# Patient Record
Sex: Male | Born: 1960 | Race: Black or African American | Hispanic: No | Marital: Single | Smoking: Former smoker
Health system: Southern US, Community
[De-identification: ages and names within clinical notes are randomized; demographics above are authoritative.]

## PROBLEM LIST (undated history)

## (undated) DIAGNOSIS — K219 Gastro-esophageal reflux disease without esophagitis: Secondary | ICD-10-CM

## (undated) DIAGNOSIS — G4730 Sleep apnea, unspecified: Secondary | ICD-10-CM

## (undated) DIAGNOSIS — I10 Essential (primary) hypertension: Secondary | ICD-10-CM

## (undated) DIAGNOSIS — D649 Anemia, unspecified: Secondary | ICD-10-CM

## (undated) DIAGNOSIS — D573 Sickle-cell trait: Secondary | ICD-10-CM

## (undated) DIAGNOSIS — K759 Inflammatory liver disease, unspecified: Secondary | ICD-10-CM

## (undated) DIAGNOSIS — M1990 Unspecified osteoarthritis, unspecified site: Secondary | ICD-10-CM

## (undated) HISTORY — PX: KNEE SURGERY: SHX244

---

## 1999-12-14 ENCOUNTER — Encounter: Payer: Self-pay | Admitting: Oncology

## 1999-12-14 ENCOUNTER — Encounter: Admission: RE | Admit: 1999-12-14 | Discharge: 1999-12-14 | Payer: Self-pay | Admitting: Oncology

## 2001-01-02 ENCOUNTER — Emergency Department (HOSPITAL_COMMUNITY): Admission: EM | Admit: 2001-01-02 | Discharge: 2001-01-02 | Payer: Self-pay | Admitting: Emergency Medicine

## 2001-01-02 ENCOUNTER — Encounter: Payer: Self-pay | Admitting: Emergency Medicine

## 2001-09-25 ENCOUNTER — Emergency Department (HOSPITAL_COMMUNITY): Admission: EM | Admit: 2001-09-25 | Discharge: 2001-09-25 | Payer: Self-pay | Admitting: Emergency Medicine

## 2002-09-05 ENCOUNTER — Encounter: Payer: Self-pay | Admitting: Internal Medicine

## 2002-09-05 ENCOUNTER — Encounter: Admission: RE | Admit: 2002-09-05 | Discharge: 2002-09-05 | Payer: Self-pay | Admitting: Internal Medicine

## 2004-11-08 ENCOUNTER — Ambulatory Visit: Payer: Self-pay | Admitting: Oncology

## 2005-05-03 ENCOUNTER — Ambulatory Visit: Payer: Self-pay | Admitting: Oncology

## 2006-04-29 ENCOUNTER — Ambulatory Visit: Payer: Self-pay | Admitting: Oncology

## 2006-05-04 LAB — CBC WITH DIFFERENTIAL/PLATELET
BASO%: 0.7 % (ref 0.0–2.0)
Basophils Absolute: 0.1 10*3/uL (ref 0.0–0.1)
EOS%: 5.4 % (ref 0.0–7.0)
Eosinophils Absolute: 0.5 10*3/uL (ref 0.0–0.5)
HCT: 40.7 % (ref 38.7–49.9)
HGB: 13.5 g/dL (ref 13.0–17.1)
LYMPH%: 30.4 % (ref 14.0–48.0)
MCH: 28.4 pg (ref 28.0–33.4)
MCHC: 33.2 g/dL (ref 32.0–35.9)
MCV: 85.6 fL (ref 81.6–98.0)
MONO#: 0.4 10*3/uL (ref 0.1–0.9)
MONO%: 4.6 % (ref 0.0–13.0)
NEUT#: 5.3 10*3/uL (ref 1.5–6.5)
NEUT%: 58.9 % (ref 40.0–75.0)
Platelets: 145 10*3/uL (ref 145–400)
RBC: 4.75 10*6/uL (ref 4.20–5.71)
RDW: 17.7 % — ABNORMAL HIGH (ref 11.2–14.6)
WBC: 9 10*3/uL (ref 4.0–10.0)
lymph#: 2.7 10*3/uL (ref 0.9–3.3)

## 2007-05-01 ENCOUNTER — Ambulatory Visit: Payer: Self-pay | Admitting: Oncology

## 2007-05-31 LAB — CBC WITH DIFFERENTIAL/PLATELET
Basophils Absolute: 0 10*3/uL (ref 0.0–0.1)
Eosinophils Absolute: 0.7 10*3/uL — ABNORMAL HIGH (ref 0.0–0.5)
HGB: 13.9 g/dL (ref 13.0–17.1)
MCV: 85.1 fL (ref 81.6–98.0)
MONO#: 0.4 10*3/uL (ref 0.1–0.9)
MONO%: 4.1 % (ref 0.0–13.0)
NEUT#: 6 10*3/uL (ref 1.5–6.5)
RBC: 4.93 10*6/uL (ref 4.20–5.71)
RDW: 18 % — ABNORMAL HIGH (ref 11.2–14.6)
WBC: 10.5 10*3/uL — ABNORMAL HIGH (ref 4.0–10.0)
lymph#: 3.3 10*3/uL (ref 0.9–3.3)

## 2008-05-28 ENCOUNTER — Ambulatory Visit: Payer: Self-pay | Admitting: Oncology

## 2008-05-30 LAB — CBC WITH DIFFERENTIAL/PLATELET
BASO%: 0.6 % (ref 0.0–2.0)
Basophils Absolute: 0.1 10*3/uL (ref 0.0–0.1)
EOS%: 7.2 % — ABNORMAL HIGH (ref 0.0–7.0)
Eosinophils Absolute: 0.7 10*3/uL — ABNORMAL HIGH (ref 0.0–0.5)
HCT: 37.4 % — ABNORMAL LOW (ref 38.7–49.9)
HGB: 12.7 g/dL — ABNORMAL LOW (ref 13.0–17.1)
LYMPH%: 27.4 % (ref 14.0–48.0)
MCH: 29 pg (ref 28.0–33.4)
MCHC: 34 g/dL (ref 32.0–35.9)
MCV: 85.4 fL (ref 81.6–98.0)
MONO#: 0.3 10*3/uL (ref 0.1–0.9)
MONO%: 3.6 % (ref 0.0–13.0)
NEUT#: 5.8 10*3/uL (ref 1.5–6.5)
NEUT%: 61.2 % (ref 40.0–75.0)
Platelets: 132 10*3/uL — ABNORMAL LOW (ref 145–400)
RBC: 4.38 10*6/uL (ref 4.20–5.71)
RDW: 17.8 % — ABNORMAL HIGH (ref 11.2–14.6)
WBC: 9.4 10*3/uL (ref 4.0–10.0)
lymph#: 2.6 10*3/uL (ref 0.9–3.3)

## 2009-05-26 ENCOUNTER — Ambulatory Visit: Payer: Self-pay | Admitting: Oncology

## 2009-05-28 LAB — CBC WITH DIFFERENTIAL/PLATELET
BASO%: 0.6 % (ref 0.0–2.0)
Basophils Absolute: 0.1 10*3/uL (ref 0.0–0.1)
EOS%: 6.9 % (ref 0.0–7.0)
Eosinophils Absolute: 0.6 10*3/uL — ABNORMAL HIGH (ref 0.0–0.5)
HCT: 35.6 % — ABNORMAL LOW (ref 38.4–49.9)
HGB: 12.3 g/dL — ABNORMAL LOW (ref 13.0–17.1)
LYMPH%: 30.1 % (ref 14.0–49.0)
MCH: 27.5 pg (ref 27.2–33.4)
MCHC: 34.6 g/dL (ref 32.0–36.0)
MCV: 79.6 fL (ref 79.3–98.0)
MONO#: 0.4 10*3/uL (ref 0.1–0.9)
MONO%: 4.2 % (ref 0.0–14.0)
NEUT#: 5.4 10*3/uL (ref 1.5–6.5)
NEUT%: 58.2 % (ref 39.0–75.0)
Platelets: 102 10*3/uL — ABNORMAL LOW (ref 140–400)
RBC: 4.47 10*6/uL (ref 4.20–5.82)
RDW: 16.9 % — ABNORMAL HIGH (ref 11.0–14.6)
WBC: 9.3 10*3/uL (ref 4.0–10.3)
lymph#: 2.8 10*3/uL (ref 0.9–3.3)
nRBC: 1 % — ABNORMAL HIGH (ref 0–0)

## 2009-11-23 ENCOUNTER — Ambulatory Visit: Payer: Self-pay | Admitting: Oncology

## 2009-11-25 LAB — CBC WITH DIFFERENTIAL/PLATELET
BASO%: 1.5 % (ref 0.0–2.0)
Basophils Absolute: 0.2 10*3/uL — ABNORMAL HIGH (ref 0.0–0.1)
EOS%: 5.3 % (ref 0.0–7.0)
Eosinophils Absolute: 0.6 10*3/uL — ABNORMAL HIGH (ref 0.0–0.5)
HCT: 37 % — ABNORMAL LOW (ref 38.4–49.9)
HGB: 12.4 g/dL — ABNORMAL LOW (ref 13.0–17.1)
LYMPH%: 31.8 % (ref 14.0–49.0)
MCH: 28.7 pg (ref 27.2–33.4)
MCHC: 33.6 g/dL (ref 32.0–36.0)
MCV: 85.2 fL (ref 79.3–98.0)
MONO#: 0.6 10*3/uL (ref 0.1–0.9)
MONO%: 5.3 % (ref 0.0–14.0)
NEUT#: 5.9 10*3/uL (ref 1.5–6.5)
NEUT%: 56.1 % (ref 39.0–75.0)
Platelets: 132 10*3/uL — ABNORMAL LOW (ref 140–400)
RBC: 4.34 10*6/uL (ref 4.20–5.82)
RDW: 17.9 % — ABNORMAL HIGH (ref 11.0–14.6)
WBC: 10.6 10*3/uL — ABNORMAL HIGH (ref 4.0–10.3)
lymph#: 3.4 10*3/uL — ABNORMAL HIGH (ref 0.9–3.3)

## 2010-05-25 ENCOUNTER — Ambulatory Visit: Payer: Self-pay | Admitting: Oncology

## 2010-05-27 LAB — CBC WITH DIFFERENTIAL/PLATELET
BASO%: 0.3 % (ref 0.0–2.0)
Basophils Absolute: 0 10*3/uL (ref 0.0–0.1)
EOS%: 5.4 % (ref 0.0–7.0)
Eosinophils Absolute: 0.6 10*3/uL — ABNORMAL HIGH (ref 0.0–0.5)
HCT: 41.4 % (ref 38.4–49.9)
HGB: 13.9 g/dL (ref 13.0–17.1)
LYMPH%: 28.6 % (ref 14.0–49.0)
MCH: 28.6 pg (ref 27.2–33.4)
MCHC: 33.6 g/dL (ref 32.0–36.0)
MCV: 85.3 fL (ref 79.3–98.0)
MONO#: 0.5 10*3/uL (ref 0.1–0.9)
MONO%: 4.6 % (ref 0.0–14.0)
NEUT#: 7 10*3/uL — ABNORMAL HIGH (ref 1.5–6.5)
NEUT%: 61.1 % (ref 39.0–75.0)
Platelets: 137 10*3/uL — ABNORMAL LOW (ref 140–400)
RBC: 4.86 10*6/uL (ref 4.20–5.82)
RDW: 17.7 % — ABNORMAL HIGH (ref 11.0–14.6)
WBC: 11.4 10*3/uL — ABNORMAL HIGH (ref 4.0–10.3)
lymph#: 3.3 10*3/uL (ref 0.9–3.3)

## 2011-05-16 ENCOUNTER — Telehealth: Payer: Self-pay | Admitting: Oncology

## 2011-05-16 NOTE — Telephone Encounter (Signed)
per Dr. Truett Perna moved pt off his schedule to [facility] for 01-24

## 2011-05-26 ENCOUNTER — Other Ambulatory Visit: Payer: Self-pay | Admitting: Lab

## 2011-05-26 ENCOUNTER — Other Ambulatory Visit: Payer: Self-pay | Admitting: *Deleted

## 2011-05-26 ENCOUNTER — Ambulatory Visit: Payer: Self-pay | Admitting: Nurse Practitioner

## 2011-05-26 ENCOUNTER — Ambulatory Visit: Payer: Self-pay | Admitting: Oncology

## 2011-05-26 ENCOUNTER — Telehealth: Payer: Self-pay | Admitting: *Deleted

## 2011-05-26 ENCOUNTER — Telehealth: Payer: Self-pay | Admitting: Oncology

## 2011-05-26 NOTE — Telephone Encounter (Signed)
Called pt to reschedule office visit due to unforeseen schedule changes. Pt agrees to move appt one month. He denies any new issues/ changes in clinical status.

## 2011-05-26 NOTE — Telephone Encounter (Signed)
Due to LT out 1/24 appt moved to 2/21 per 1/24 pof. Pt has been contacted by nurse and informed that appt would be r/s'd. Feb schedule mailed.

## 2011-06-23 ENCOUNTER — Ambulatory Visit (HOSPITAL_BASED_OUTPATIENT_CLINIC_OR_DEPARTMENT_OTHER): Payer: BC Managed Care – PPO | Admitting: Nurse Practitioner

## 2011-06-23 ENCOUNTER — Other Ambulatory Visit (HOSPITAL_BASED_OUTPATIENT_CLINIC_OR_DEPARTMENT_OTHER): Payer: BC Managed Care – PPO | Admitting: Lab

## 2011-06-23 DIAGNOSIS — D696 Thrombocytopenia, unspecified: Secondary | ICD-10-CM

## 2011-06-23 DIAGNOSIS — D582 Other hemoglobinopathies: Secondary | ICD-10-CM

## 2011-06-23 DIAGNOSIS — D72829 Elevated white blood cell count, unspecified: Secondary | ICD-10-CM

## 2011-06-23 DIAGNOSIS — D731 Hypersplenism: Secondary | ICD-10-CM

## 2011-06-23 LAB — CBC WITH DIFFERENTIAL/PLATELET
Basophils Absolute: 0.1 10*3/uL (ref 0.0–0.1)
Eosinophils Absolute: 0.4 10*3/uL (ref 0.0–0.5)
HCT: 38.6 % (ref 38.4–49.9)
HGB: 13 g/dL (ref 13.0–17.1)
LYMPH%: 25.9 % (ref 14.0–49.0)
MCV: 84.7 fL (ref 79.3–98.0)
MONO#: 0.4 10*3/uL (ref 0.1–0.9)
MONO%: 3.5 % (ref 0.0–14.0)
NEUT#: 7.1 10*3/uL — ABNORMAL HIGH (ref 1.5–6.5)
NEUT%: 65.8 % (ref 39.0–75.0)
Platelets: 130 10*3/uL — ABNORMAL LOW (ref 140–400)
WBC: 10.8 10*3/uL — ABNORMAL HIGH (ref 4.0–10.3)

## 2011-06-23 MED ORDER — PNEUMOCOCCAL VAC POLYVALENT 25 MCG/0.5ML IJ INJ
0.5000 mL | INJECTION | Freq: Once | INTRAMUSCULAR | Status: DC
  Filled 2011-06-23: qty 0.5

## 2011-06-23 NOTE — Progress Notes (Signed)
OFFICE PROGRESS NOTE  Interval history:  Larry Berry returns as scheduled. He feels well. No interim illnesses or infections. He has a good appetite and overall good energy level. He denies fever and sweats. No cough or shortness of breath. No bowel or bladder problems.   Objective: Blood pressure 133/80, pulse 74, temperature 96.6 F (35.9 C), temperature source Oral, height 6' (1.829 m), weight 230 lb (104.327 kg).  Oropharynx is without thrush or ulceration. No palpable cervical, supraclavicular or axillary lymph nodes. Lungs are clear. No wheezes or rales. Regular cardiac rhythm. Abdomen is soft. No hepatomegaly. Spleen is palpable at the medial left upper quadrant. Extremities are without edema.  Lab Results: Lab Results  Component Value Date   WBC 10.8* 06/23/2011   HGB 13.0 06/23/2011   HCT 38.6 06/23/2011   MCV 84.7 06/23/2011   PLT 130* 06/23/2011    Chemistry:    Chemistry   No results found for this basename: NA, K, CL, CO2, BUN, CREATININE, GLU   No results found for this basename: CALCIUM, ALKPHOS, AST, ALT, BILITOT       Studies/Results: No results found.  Medications: He takes no medications.  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 2. 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 3. Pneumococcal vaccine - he last received a pneumococcal vaccine in January 2012.  4. Chronic mild leukocytosis.   Larry Berry remains stable from a hematologic standpoint. He will return for an office visit and CBC in one year. He will contact the office the interim with any problems.  Plan reviewed with Dr. Truett Perna.  Larry Berry ANP/GNP-BC

## 2012-06-22 ENCOUNTER — Ambulatory Visit (HOSPITAL_BASED_OUTPATIENT_CLINIC_OR_DEPARTMENT_OTHER): Payer: BC Managed Care – PPO | Admitting: Oncology

## 2012-06-22 ENCOUNTER — Other Ambulatory Visit (HOSPITAL_BASED_OUTPATIENT_CLINIC_OR_DEPARTMENT_OTHER): Payer: BC Managed Care – PPO

## 2012-06-22 VITALS — BP 124/78 | HR 68 | Temp 97.4°F | Resp 20 | Ht 72.0 in | Wt 233.0 lb

## 2012-06-22 DIAGNOSIS — D582 Other hemoglobinopathies: Secondary | ICD-10-CM

## 2012-06-22 DIAGNOSIS — D696 Thrombocytopenia, unspecified: Secondary | ICD-10-CM

## 2012-06-22 DIAGNOSIS — D72829 Elevated white blood cell count, unspecified: Secondary | ICD-10-CM

## 2012-06-22 LAB — CBC WITH DIFFERENTIAL/PLATELET
BASO%: 0.4 % (ref 0.0–2.0)
HCT: 38.1 % — ABNORMAL LOW (ref 38.4–49.9)
LYMPH%: 25.8 % (ref 14.0–49.0)
MCHC: 35.2 g/dL (ref 32.0–36.0)
MCV: 79.2 fL — ABNORMAL LOW (ref 79.3–98.0)
MONO#: 0.6 10*3/uL (ref 0.1–0.9)
MONO%: 4.6 % (ref 0.0–14.0)
NEUT%: 65.8 % (ref 39.0–75.0)
Platelets: 128 10*3/uL — ABNORMAL LOW (ref 140–400)
RBC: 4.81 10*6/uL (ref 4.20–5.82)
WBC: 13.6 10*3/uL — ABNORMAL HIGH (ref 4.0–10.3)
nRBC: 1 % — ABNORMAL HIGH (ref 0–0)

## 2012-06-22 NOTE — Progress Notes (Signed)
   [facility]    OFFICE PROGRESS NOTE   INTERVAL HISTORY:   He returns as scheduled. No complaint. He is followed by Dr. Eula Listen for internal medicine care. He is currently taking no medications.  Objective:  Vital signs in last 24 hours:  Blood pressure 124/78, pulse 68, temperature 97.4 F (36.3 C), temperature source Oral, resp. rate 20, height 6' (1.829 m), weight 233 lb (105.688 kg).    HEENT: Neck without mass Lymphatics: No cervical, supraclavicular, or axillary nodes Resp: Lungs clear bilateral Cardio: Regular rate and rhythm GI: Nontender, no hepatomegaly, the spleen is palpable in the left mid abdomen Vascular: No leg edema   Lab Results:  Lab Results  Component Value Date   WBC 13.6* 06/22/2012   HGB 13.4 06/22/2012   HCT 38.1* 06/22/2012   MCV 79.2* 06/22/2012   PLT 128* 06/22/2012   ANC 9.0   Medications: I have reviewed the patient's current medications.  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 2. 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 3. Pneumococcal vaccine - he last received a pneumococcal vaccine in January 2012.  4. Chronic mild leukocytosis.   Disposition:  He appears stable. Mr. Gerding would like to continue followup in the hematology clinic. He will return for an office visit and CBC in one year. He will remain up-to-date on the pneumococcal vaccine.   Thornton Papas, MD  06/22/2012  12:13 PM

## 2012-06-25 ENCOUNTER — Telehealth: Payer: Self-pay | Admitting: Oncology

## 2012-06-25 NOTE — Telephone Encounter (Signed)
lmonvm for pt re appt for 06/20/13. Schedule mailed.

## 2013-06-20 ENCOUNTER — Other Ambulatory Visit: Payer: BC Managed Care – PPO

## 2013-06-20 ENCOUNTER — Ambulatory Visit: Payer: BC Managed Care – PPO | Admitting: Oncology

## 2013-11-06 ENCOUNTER — Telehealth: Payer: Self-pay | Admitting: Oncology

## 2013-11-06 NOTE — Telephone Encounter (Signed)
pt called to r/s missed appt...done....mailed pt appt sched and letter

## 2014-01-29 ENCOUNTER — Other Ambulatory Visit: Payer: Self-pay | Admitting: Gastroenterology

## 2014-01-31 NOTE — Addendum Note (Signed)
Addended by: Earle Gell on: 01/31/2014 10:58 AM   Modules accepted: Orders

## 2014-02-06 ENCOUNTER — Telehealth: Payer: Self-pay | Admitting: Oncology

## 2014-02-06 NOTE — Telephone Encounter (Signed)
Pt called states he was told to leave back in Feb of this year due to long line and that we would call him with his schedule, pt states no one called him and that he called [place] in July and was advised that he needed to sch now that no sch was available in Feb of next year. Pt cancels 10/09 apt and r/s for Feb of next year. I confirmed with pt new sch and went over add and advised I would mail out new sch to pt ...Marland KitchenMarland KitchenMarland Kitchen KJ

## 2014-02-07 ENCOUNTER — Ambulatory Visit: Payer: BC Managed Care – PPO | Admitting: Oncology

## 2014-02-07 ENCOUNTER — Other Ambulatory Visit: Payer: BC Managed Care – PPO

## 2014-03-24 ENCOUNTER — Other Ambulatory Visit: Payer: Self-pay | Admitting: Internal Medicine

## 2014-03-24 ENCOUNTER — Ambulatory Visit
Admission: RE | Admit: 2014-03-24 | Discharge: 2014-03-24 | Disposition: A | Discharge status: Home / Self Care | Payer: BC Managed Care – PPO | Source: Ambulatory Visit | Attending: Internal Medicine | Admitting: Internal Medicine

## 2014-03-24 DIAGNOSIS — M79674 Pain in right toe(s): Secondary | ICD-10-CM

## 2014-04-22 ENCOUNTER — Encounter (HOSPITAL_COMMUNITY): Payer: Self-pay | Admitting: *Deleted

## 2014-04-28 ENCOUNTER — Other Ambulatory Visit: Payer: Self-pay | Admitting: Gastroenterology

## 2014-05-06 ENCOUNTER — Ambulatory Visit (HOSPITAL_COMMUNITY): Payer: BC Managed Care – PPO | Admitting: Anesthesiology

## 2014-05-06 ENCOUNTER — Encounter (HOSPITAL_COMMUNITY): Payer: Self-pay | Admitting: *Deleted

## 2014-05-06 ENCOUNTER — Encounter (HOSPITAL_COMMUNITY)
Admission: RE | Disposition: A | Discharge status: Home / Self Care | Payer: Self-pay | Source: Ambulatory Visit | Attending: Gastroenterology

## 2014-05-06 ENCOUNTER — Ambulatory Visit (HOSPITAL_COMMUNITY)
Admission: RE | Admit: 2014-05-06 | Discharge: 2014-05-06 | Disposition: A | Discharge status: Home / Self Care | Payer: BC Managed Care – PPO | Source: Ambulatory Visit | Attending: Gastroenterology | Admitting: Gastroenterology

## 2014-05-06 DIAGNOSIS — Z87891 Personal history of nicotine dependence: Secondary | ICD-10-CM | POA: Insufficient documentation

## 2014-05-06 DIAGNOSIS — Z1211 Encounter for screening for malignant neoplasm of colon: Secondary | ICD-10-CM | POA: Diagnosis present

## 2014-05-06 DIAGNOSIS — D731 Hypersplenism: Secondary | ICD-10-CM | POA: Diagnosis not present

## 2014-05-06 DIAGNOSIS — D125 Benign neoplasm of sigmoid colon: Secondary | ICD-10-CM | POA: Diagnosis not present

## 2014-05-06 DIAGNOSIS — D696 Thrombocytopenia, unspecified: Secondary | ICD-10-CM | POA: Diagnosis not present

## 2014-05-06 DIAGNOSIS — K219 Gastro-esophageal reflux disease without esophagitis: Secondary | ICD-10-CM | POA: Insufficient documentation

## 2014-05-06 DIAGNOSIS — M1990 Unspecified osteoarthritis, unspecified site: Secondary | ICD-10-CM | POA: Insufficient documentation

## 2014-05-06 DIAGNOSIS — Z862 Personal history of diseases of the blood and blood-forming organs and certain disorders involving the immune mechanism: Secondary | ICD-10-CM | POA: Diagnosis not present

## 2014-05-06 DIAGNOSIS — G4733 Obstructive sleep apnea (adult) (pediatric): Secondary | ICD-10-CM | POA: Insufficient documentation

## 2014-05-06 HISTORY — DX: Sleep apnea, unspecified: G47.30

## 2014-05-06 HISTORY — DX: Anemia, unspecified: D64.9

## 2014-05-06 HISTORY — DX: Inflammatory liver disease, unspecified: K75.9

## 2014-05-06 HISTORY — PX: COLONOSCOPY WITH PROPOFOL: SHX5780

## 2014-05-06 HISTORY — DX: Sickle-cell trait: D57.3

## 2014-05-06 HISTORY — DX: Unspecified osteoarthritis, unspecified site: M19.90

## 2014-05-06 HISTORY — DX: Gastro-esophageal reflux disease without esophagitis: K21.9

## 2014-05-06 SURGERY — COLONOSCOPY WITH PROPOFOL
Anesthesia: Monitor Anesthesia Care

## 2014-05-06 MED ORDER — LACTATED RINGERS IV SOLN
INTRAVENOUS | Status: DC
  Administered 2014-05-06: 10:00:00 via INTRAVENOUS

## 2014-05-06 MED ORDER — PROPOFOL 10 MG/ML IV BOLUS
INTRAVENOUS | Status: DC | PRN
Start: 2014-05-06 — End: 2014-05-06
  Administered 2014-05-06 (×4): 20 mg via INTRAVENOUS
  Administered 2014-05-06: 50 mg via INTRAVENOUS

## 2014-05-06 MED ORDER — PROPOFOL 10 MG/ML IV BOLUS
INTRAVENOUS | Status: AC
  Filled 2014-05-06: qty 20

## 2014-05-06 MED ORDER — PROPOFOL INFUSION 10 MG/ML OPTIME
INTRAVENOUS | Status: DC | PRN
  Administered 2014-05-06: 120 ug/kg/min via INTRAVENOUS

## 2014-05-06 MED ORDER — SODIUM CHLORIDE 0.9 % IV SOLN: INTRAVENOUS | Status: DC

## 2014-05-06 SURGICAL SUPPLY — 21 items

## 2014-05-06 NOTE — Op Note (Signed)
Procedure: Baseline screening colonoscopy  Endoscopist: Earle Gell  Premedication: Propofol administered by anesthesia  Procedure: The patient was placed in the left lateral decubitus position. Anal inspection and digital rectal exam were normal. The Pentax pediatric colonoscope was introduced into the rectum and advanced to the cecum. A normal-appearing appendiceal orifice was identified. A normal-appearing ileocecal valve was identified. Colonic preparation for the exam today was good. Withdrawal time was 12 minutes  Rectum. Normal. Retroflexed view of the distal rectum.  Sigmoid colon. A 3 mm sessile polyp was removed with the cold biopsy forceps from the mid sigmoid colon  Descending colon. Normal  Splenic flexure. Normal  Transverse colon. Normal  Hepatic flexure. Normal  Ascending colon. Normal  Cecum and ileocecal valve. Normal  Assessment: A small polyp was removed from the mid sigmoid colon with the cold biopsy forceps; otherwise normal colonoscopy  Recommendation: If the sigmoid colon polyp returns adenomatous pathologically, the patient should undergo a surveillance colonoscopy in 5 years. If the polyp returns nonneoplastic pathologically, she should undergo a repeat screening colonoscopy in 10 years

## 2014-05-06 NOTE — Transfer of Care (Signed)
Immediate Anesthesia Transfer of Care Note  Patient: Larry Berry  Procedure(s) Performed: Procedure(s) (LRB): COLONOSCOPY WITH PROPOFOL (N/A)  Patient Location: PACU  Anesthesia Type: MAC  Level of Consciousness: sedated, patient cooperative and responds to stimulation  Airway & Oxygen Therapy: Patient Spontanous Breathing and Patient connected to face mask oxgen  Post-op Assessment: Report given to PACU RN and Post -op Vital signs reviewed and stable  Post vital signs: Reviewed and stable  Complications: No apparent anesthesia complications

## 2014-05-06 NOTE — Anesthesia Preprocedure Evaluation (Signed)
Anesthesia Evaluation  Patient identified by MRN, date of birth, ID band Patient awake    Reviewed: Allergy & Precautions, NPO status , Patient's Chart, lab work & pertinent test results  Airway Mallampati: II  TM Distance: >3 FB Neck ROM: Full    Dental no notable dental hx.    Pulmonary sleep apnea and Continuous Positive Airway Pressure Ventilation , former smoker,  breath sounds clear to auscultation  Pulmonary exam normal       Cardiovascular negative cardio ROS  Rhythm:Regular Rate:Normal     Neuro/Psych negative neurological ROS  negative psych ROS   GI/Hepatic GERD-  ,(+) Hepatitis -  Endo/Other  negative endocrine ROS  Renal/GU negative Renal ROS  negative genitourinary   Musculoskeletal  (+) Arthritis -,   Abdominal   Peds negative pediatric ROS (+)  Hematology  (+) anemia ,   Anesthesia Other Findings   Reproductive/Obstetrics negative OB ROS                             Anesthesia Physical Anesthesia Plan  ASA: II  Anesthesia Plan: MAC   Post-op Pain Management:    Induction: Intravenous  Airway Management Planned:   Additional Equipment:   Intra-op Plan:   Post-operative Plan:   Informed Consent: I have reviewed the patients History and Physical, chart, labs and discussed the procedure including the risks, benefits and alternatives for the proposed anesthesia with the patient or authorized representative who has indicated his/her understanding and acceptance.   Dental advisory given  Plan Discussed with: CRNA  Anesthesia Plan Comments:         Anesthesia Quick Evaluation

## 2014-05-06 NOTE — Anesthesia Postprocedure Evaluation (Signed)
  Anesthesia Post-op Note  Patient: Larry Berry  Procedure(s) Performed: Procedure(s) (LRB): COLONOSCOPY WITH PROPOFOL (N/A)  Patient Location: PACU  Anesthesia Type: MAC  Level of Consciousness: awake and alert   Airway and Oxygen Therapy: Patient Spontanous Breathing  Post-op Pain: mild  Post-op Assessment: Post-op Vital signs reviewed, Patient's Cardiovascular Status Stable, Respiratory Function Stable, Patent Airway and No signs of Nausea or vomiting  Last Vitals:  Filed Vitals:   05/06/14 1110  BP: 134/110  Pulse: 62  Temp:   Resp: 15    Post-op Vital Signs: stable   Complications: No apparent anesthesia complications

## 2014-05-06 NOTE — H&P (Signed)
  Procedure: Baseline screening colonoscopy  History: The patient is a 54 year old male born 05/21/1960. He has obstructive sleep apnea syndrome. He is scheduled to undergo his first screening colonoscopy with polypectomy to prevent colon cancer.  Medication allergies: None  Past medical history: Right knee surgery to repair ACL. Obstructive sleep apnea syndrome. Gastroesophageal reflux. Allergic rhinitis. Sickle cell trait. Hypersplenism with anemia and thrombocytopenia  Exam: The patient is alert and lying comfortably on the endoscopy stretcher. Abdomen is soft and nontender to palpation. Lungs are clear to auscultation. Cardiac exam reveals a regular rhythm.  Plan: Proceed with screening colonoscopy

## 2014-05-07 ENCOUNTER — Encounter (HOSPITAL_COMMUNITY): Payer: Self-pay | Admitting: Gastroenterology

## 2014-06-27 ENCOUNTER — Other Ambulatory Visit: Payer: Self-pay | Admitting: *Deleted

## 2014-06-27 DIAGNOSIS — D582 Other hemoglobinopathies: Secondary | ICD-10-CM

## 2014-06-30 ENCOUNTER — Telehealth: Payer: Self-pay | Admitting: *Deleted

## 2014-06-30 ENCOUNTER — Telehealth: Payer: Self-pay | Admitting: Oncology

## 2014-06-30 ENCOUNTER — Other Ambulatory Visit (HOSPITAL_BASED_OUTPATIENT_CLINIC_OR_DEPARTMENT_OTHER): Payer: BC Managed Care – PPO

## 2014-06-30 ENCOUNTER — Ambulatory Visit (HOSPITAL_BASED_OUTPATIENT_CLINIC_OR_DEPARTMENT_OTHER): Payer: BC Managed Care – PPO | Admitting: Oncology

## 2014-06-30 VITALS — BP 147/87 | HR 66 | Temp 98.2°F | Resp 19 | Ht 73.0 in | Wt 236.3 lb

## 2014-06-30 DIAGNOSIS — Z23 Encounter for immunization: Secondary | ICD-10-CM

## 2014-06-30 DIAGNOSIS — D72829 Elevated white blood cell count, unspecified: Secondary | ICD-10-CM

## 2014-06-30 DIAGNOSIS — D582 Other hemoglobinopathies: Secondary | ICD-10-CM

## 2014-06-30 DIAGNOSIS — D696 Thrombocytopenia, unspecified: Secondary | ICD-10-CM

## 2014-06-30 LAB — CBC WITH DIFFERENTIAL/PLATELET
BASO%: 0.5 % (ref 0.0–2.0)
Basophils Absolute: 0.1 10*3/uL (ref 0.0–0.1)
EOS%: 6.1 % (ref 0.0–7.0)
Eosinophils Absolute: 0.6 10*3/uL — ABNORMAL HIGH (ref 0.0–0.5)
HCT: 37 % — ABNORMAL LOW (ref 38.4–49.9)
HGB: 13.1 g/dL (ref 13.0–17.1)
LYMPH#: 3.1 10*3/uL (ref 0.9–3.3)
LYMPH%: 33.8 % (ref 14.0–49.0)
MCH: 26.8 pg — AB (ref 27.2–33.4)
MCHC: 35.4 g/dL (ref 32.0–36.0)
MCV: 75.7 fL — AB (ref 79.3–98.0)
MONO#: 0.5 10*3/uL (ref 0.1–0.9)
MONO%: 5.5 % (ref 0.0–14.0)
NEUT#: 5 10*3/uL (ref 1.5–6.5)
NEUT%: 54.1 % (ref 39.0–75.0)
Platelets: 115 10*3/uL — ABNORMAL LOW (ref 140–400)
RBC: 4.89 10*6/uL (ref 4.20–5.82)
RDW: 17.4 % — AB (ref 11.0–14.6)
WBC: 9.2 10*3/uL (ref 4.0–10.3)
nRBC: 1 % — ABNORMAL HIGH (ref 0–0)

## 2014-06-30 MED ORDER — PNEUMOCOCCAL 13-VAL CONJ VACC IM SUSP
0.5000 mL | Freq: Once | INTRAMUSCULAR | Status: AC
  Administered 2014-06-30: 0.5 mL via INTRAMUSCULAR
  Filled 2014-06-30: qty 0.5

## 2014-06-30 NOTE — Progress Notes (Signed)
  [facility] OFFICE PROGRESS NOTE   Diagnosis: Hemoglobin C disease  INTERVAL HISTORY:   Mr. Deems returns as scheduled. He feels well. He notes splenomegaly, but is not bothered by this. He reports arthritis in one of his great toes. He has been evaluated by Dr. Lysle Rubens.  Objective:  Vital signs in last 24 hours:  Blood pressure 147/87, pulse 66, temperature 98.2 F (36.8 C), temperature source Oral, resp. rate 19, height 6\' 1"  (1.854 m), weight 236 lb 4.8 oz (107.185 kg), SpO2 99 %.   Resp: Lungs clear bilaterally Cardio: Regular rate and rhythm GI: No hepatomegaly, the spleen is palpable in the left upper abdomen Vascular: No Leg edema  Lab Results:  Lab Results  Component Value Date   WBC 9.2 06/30/2014   HGB 13.1 06/30/2014   HCT 37.0* 06/30/2014   MCV 75.7* 06/30/2014   PLT 115* 06/30/2014   NEUTROABS 5.0 06/30/2014    Medications: I have reviewed the patient's current medications.  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 2. 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 3. Pneumococcal vaccine - he last received a 23 valent pneumococcal vaccine in January 2012. He received the 13 valent pneumococcal vaccine today. 4. Chronic mild leukocytosis    Disposition:  Mr. Karbowski is stable from a hematologic standpoint. We administered a 13 valent pneumococcal vaccine today. He will receive the 23 valent pneumococcal vaccine when he returns next year.  He will return for an office visit and CBC in one year.  Betsy Coder, MD  06/30/2014  9:53 AM

## 2014-06-30 NOTE — Telephone Encounter (Signed)
-----   Message from Larry Pier, MD sent at 06/30/2014 12:49 PM EST ----- Please call patient, be sure he has his routine screening colonoscopy

## 2014-06-30 NOTE — Telephone Encounter (Signed)
gv adn printed appt sched and avs for pt for Feb 2017 °

## 2014-06-30 NOTE — Telephone Encounter (Signed)
Per Dr. Benay Spice; spoke to pt re: routine screening colonoscopy; pt states "I've had one already this January 2016"  Dr. Benay Spice notified.

## 2015-06-18 ENCOUNTER — Telehealth: Payer: Self-pay | Admitting: Oncology

## 2015-06-30 ENCOUNTER — Other Ambulatory Visit: Payer: BC Managed Care – PPO

## 2015-06-30 ENCOUNTER — Ambulatory Visit: Payer: BC Managed Care – PPO | Admitting: Oncology

## 2015-07-21 ENCOUNTER — Telehealth: Payer: Self-pay | Admitting: Oncology

## 2015-07-21 ENCOUNTER — Other Ambulatory Visit: Payer: Self-pay | Admitting: *Deleted

## 2015-07-21 ENCOUNTER — Ambulatory Visit (HOSPITAL_BASED_OUTPATIENT_CLINIC_OR_DEPARTMENT_OTHER): Payer: BC Managed Care – PPO | Admitting: Oncology

## 2015-07-21 ENCOUNTER — Other Ambulatory Visit (HOSPITAL_BASED_OUTPATIENT_CLINIC_OR_DEPARTMENT_OTHER): Payer: BC Managed Care – PPO

## 2015-07-21 VITALS — BP 136/90 | HR 65 | Temp 97.8°F | Resp 18 | Ht 73.0 in | Wt 248.4 lb

## 2015-07-21 DIAGNOSIS — D72829 Elevated white blood cell count, unspecified: Secondary | ICD-10-CM

## 2015-07-21 DIAGNOSIS — D582 Other hemoglobinopathies: Secondary | ICD-10-CM

## 2015-07-21 DIAGNOSIS — D696 Thrombocytopenia, unspecified: Secondary | ICD-10-CM

## 2015-07-21 LAB — CBC WITH DIFFERENTIAL/PLATELET
BASO%: 0.4 % (ref 0.0–2.0)
BASOS ABS: 0.1 10*3/uL (ref 0.0–0.1)
EOS%: 5.9 % (ref 0.0–7.0)
Eosinophils Absolute: 0.7 10*3/uL — ABNORMAL HIGH (ref 0.0–0.5)
HCT: 37.4 % — ABNORMAL LOW (ref 38.4–49.9)
HGB: 13.4 g/dL (ref 13.0–17.1)
LYMPH%: 34.6 % (ref 14.0–49.0)
MCH: 27.2 pg (ref 27.2–33.4)
MCHC: 35.8 g/dL (ref 32.0–36.0)
MCV: 76 fL — ABNORMAL LOW (ref 79.3–98.0)
MONO#: 0.6 10*3/uL (ref 0.1–0.9)
MONO%: 4.7 % (ref 0.0–14.0)
NEUT#: 6.3 10*3/uL (ref 1.5–6.5)
NEUT%: 54.4 % (ref 39.0–75.0)
NRBC: 1 % — AB (ref 0–0)
Platelets: 124 10*3/uL — ABNORMAL LOW (ref 140–400)
RBC: 4.92 10*6/uL (ref 4.20–5.82)
RDW: 17.3 % — AB (ref 11.0–14.6)
WBC: 11.6 10*3/uL — AB (ref 4.0–10.3)
lymph#: 4 10*3/uL — ABNORMAL HIGH (ref 0.9–3.3)

## 2015-07-21 MED ORDER — PNEUMOCOCCAL VAC POLYVALENT 25 MCG/0.5ML IJ INJ
0.5000 mL | INJECTION | Freq: Once | INTRAMUSCULAR | Status: AC
  Administered 2015-07-21: 0.5 mL via INTRAMUSCULAR
  Filled 2015-07-21: qty 0.5

## 2015-07-21 NOTE — Telephone Encounter (Signed)
per pof to sch pt appt-gave pt copy of avs °

## 2015-07-21 NOTE — Progress Notes (Signed)
  [facility] OFFICE PROGRESS NOTE   Diagnosis: Hemoglobin C disease  INTERVAL HISTORY:   Mr. Larry Berry returns as scheduled. He feels well. No complaint. He is trying to lose weight.  Objective:  Vital signs in last 24 hours:  Blood pressure 136/90, pulse 65, temperature 97.8 F (36.6 C), temperature source Oral, resp. rate 18, height 6\' 1"  (1.854 m), weight 248 lb 6.4 oz (112.674 kg), SpO2 99 %.    HEENT: Neck without mass Lymphatics: No cervical or supra-clavicular nodes Resp: Lungs clear bilaterally Cardio: Regular rate and rhythm GI: No hepatomegaly, the spleen tip is palpable in the left mid to upper abdomen Vascular: No leg edema   Lab Results:  Lab Results  Component Value Date   WBC 11.6* 07/21/2015   HGB 13.4 07/21/2015   HCT 37.4* 07/21/2015   MCV 76.0* 07/21/2015   PLT 124* 07/21/2015   NEUTROABS 6.3 07/21/2015   Absolute lymphocyte count 4.0  Medications: I have reviewed the patient's current medications.  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 2. 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 3. Pneumococcal vaccine - eceived a 23 valent pneumococcal vaccine in 07/21/2015. He received the 13 valent pneumococcal vaccine 06/30/2014 4. Chronic mild leukocytosis     Disposition:  Larry Berry appears stable. He would like to continue follow-up in the hematology clinic. He will return for an office visit and CBC in one year. He received a 23 valent pneumococcal vaccine today. He declines an influenza vaccine.I encouraged him to follow-up with Dr. Lysle Rubens for a blood pressure check. His blood pressure has been elevated on several occasions here.  Betsy Coder, MD  07/21/2015  3:07 PM

## 2016-03-21 ENCOUNTER — Ambulatory Visit (HOSPITAL_COMMUNITY)
Admission: EM | Admit: 2016-03-21 | Discharge: 2016-03-21 | Disposition: A | Discharge status: Home / Self Care | Payer: BC Managed Care – PPO | Attending: Emergency Medicine | Admitting: Emergency Medicine

## 2016-03-21 ENCOUNTER — Encounter (HOSPITAL_COMMUNITY): Payer: Self-pay | Admitting: Emergency Medicine

## 2016-03-21 DIAGNOSIS — M25512 Pain in left shoulder: Secondary | ICD-10-CM

## 2016-03-21 DIAGNOSIS — M545 Low back pain, unspecified: Secondary | ICD-10-CM

## 2016-03-21 MED ORDER — METHOCARBAMOL 500 MG PO TABS: 500.0000 mg | ORAL_TABLET | Freq: Four times a day (QID) | ORAL | 0 refills | Status: DC

## 2016-03-21 MED ORDER — IBUPROFEN 800 MG PO TABS: 800.0000 mg | ORAL_TABLET | Freq: Three times a day (TID) | ORAL | 0 refills | Status: DC
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
## 2016-05-25 ENCOUNTER — Telehealth: Payer: Self-pay

## 2016-05-25 NOTE — Telephone Encounter (Signed)
Received fax from [facility] - exam note received containing MRI information. Called and spoke with Misty at [facility]- need MRI study result . They will fax this over.

## 2016-07-21 ENCOUNTER — Other Ambulatory Visit: Payer: Self-pay | Admitting: *Deleted

## 2016-07-21 ENCOUNTER — Telehealth: Payer: Self-pay | Admitting: Oncology

## 2016-07-21 ENCOUNTER — Other Ambulatory Visit (HOSPITAL_BASED_OUTPATIENT_CLINIC_OR_DEPARTMENT_OTHER): Payer: BC Managed Care – PPO

## 2016-07-21 ENCOUNTER — Ambulatory Visit (HOSPITAL_BASED_OUTPATIENT_CLINIC_OR_DEPARTMENT_OTHER): Payer: BC Managed Care – PPO | Admitting: Oncology

## 2016-07-21 VITALS — BP 146/90 | HR 66 | Temp 98.6°F | Resp 20 | Wt 248.2 lb

## 2016-07-21 DIAGNOSIS — D696 Thrombocytopenia, unspecified: Secondary | ICD-10-CM

## 2016-07-21 DIAGNOSIS — D582 Other hemoglobinopathies: Secondary | ICD-10-CM

## 2016-07-21 DIAGNOSIS — D72829 Elevated white blood cell count, unspecified: Secondary | ICD-10-CM | POA: Diagnosis not present

## 2016-07-21 LAB — CBC WITH DIFFERENTIAL/PLATELET
BASO%: 0.5 % (ref 0.0–2.0)
Basophils Absolute: 0.1 10*3/uL (ref 0.0–0.1)
EOS%: 5.7 % (ref 0.0–7.0)
Eosinophils Absolute: 0.7 10*3/uL — ABNORMAL HIGH (ref 0.0–0.5)
HEMATOCRIT: 38 % — AB (ref 38.4–49.9)
HEMOGLOBIN: 13.7 g/dL (ref 13.0–17.1)
LYMPH#: 4.4 10*3/uL — AB (ref 0.9–3.3)
LYMPH%: 34 % (ref 14.0–49.0)
MCH: 27.8 pg (ref 27.2–33.4)
MCHC: 36.1 g/dL — AB (ref 32.0–36.0)
MCV: 77.1 fL — ABNORMAL LOW (ref 79.3–98.0)
MONO#: 0.6 10*3/uL (ref 0.1–0.9)
MONO%: 4.2 % (ref 0.0–14.0)
NEUT#: 7.2 10*3/uL — ABNORMAL HIGH (ref 1.5–6.5)
NEUT%: 55.6 % (ref 39.0–75.0)
Platelets: 122 10*3/uL — ABNORMAL LOW (ref 140–400)
RBC: 4.93 10*6/uL (ref 4.20–5.82)
RDW: 18.2 % — ABNORMAL HIGH (ref 11.0–14.6)
WBC: 13 10*3/uL — ABNORMAL HIGH (ref 4.0–10.3)
nRBC: 1 % — ABNORMAL HIGH (ref 0–0)

## 2016-07-21 NOTE — Progress Notes (Signed)
  [facility] OFFICE PROGRESS NOTE   Diagnosis: Hemoglobin C  INTERVAL HISTORY:   Mr. Soderman returns as scheduled. He feels well. No new complaint. No bleeding.  Objective:  Vital signs in last 24 hours:  Blood pressure (!) 146/90, pulse 66, temperature 98.6 F (37 C), temperature source Oral, resp. rate 20, weight 248 lb 3.2 oz (112.6 kg), SpO2 100 %.    Resp: Lungs clear bilaterally Cardio: Regular rate and rhythm GI: No hepatosplenomegaly. The spleen is palpable at the medial left upper abdomen Vascular: No leg edema   Lab Results:  Lab Results  Component Value Date   WBC 13.0 (H) 07/21/2016   HGB 13.7 07/21/2016   HCT 38.0 (L) 07/21/2016   MCV 77.1 (L) 07/21/2016   PLT 122 (L) 07/21/2016   NEUTROABS 7.2 (H) 07/21/2016     Medications: I have reviewed the patient's current medications.  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 2. 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 3. Pneumococcal vaccine - received a 23 valent pneumococcal vaccine in 07/21/2015. He received the 13 valent pneumococcal vaccine 06/30/2014 4. Chronic mild leukocytosis  Disposition:  Larry Berry is stable from a hematologic standpoint. He would like to continue follow-up in the Hematology clinic. He will return for an office visit and CBC in one year. He continues follow-up with Dr. Lysle Rubens for internal medicine care.  15 minutes were spent with the patient today. The majority of the time was used for counseling and coordination of care.  Betsy Coder, MD  07/21/2016  3:02 PM

## 2016-07-21 NOTE — Telephone Encounter (Signed)
Gave patient AVS and calender per 07/21/2016 los.  

## 2017-02-03 ENCOUNTER — Ambulatory Visit (INDEPENDENT_AMBULATORY_CARE_PROVIDER_SITE_OTHER): Payer: BC Managed Care – PPO

## 2017-02-03 ENCOUNTER — Ambulatory Visit (HOSPITAL_COMMUNITY)
Admission: EM | Admit: 2017-02-03 | Discharge: 2017-02-03 | Disposition: A | Discharge status: Home / Self Care | Payer: BC Managed Care – PPO | Attending: Internal Medicine | Admitting: Internal Medicine

## 2017-02-03 ENCOUNTER — Encounter (HOSPITAL_COMMUNITY): Payer: Self-pay | Admitting: *Deleted

## 2017-02-03 DIAGNOSIS — M25531 Pain in right wrist: Secondary | ICD-10-CM

## 2017-02-03 NOTE — Discharge Instructions (Signed)
X-ray negative for dislocation or fracture. Start ibuprofen 600-800 mg 3 times a day for the next 10 days. Ice compress as needed. Wear wrist splint during activity. This can take up to 3-4 weeks to completely resolve, but you should be feeling better each week. Follow up with PCP for further evaluation if symptoms do not resolve.
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
## 2017-07-21 ENCOUNTER — Inpatient Hospital Stay: Payer: BC Managed Care – PPO

## 2017-07-21 ENCOUNTER — Inpatient Hospital Stay: Payer: BC Managed Care – PPO | Attending: Oncology | Admitting: Oncology

## 2017-07-21 ENCOUNTER — Telehealth: Payer: Self-pay | Admitting: Oncology

## 2017-07-21 VITALS — BP 144/96 | HR 61 | Temp 98.3°F | Resp 17 | Ht 73.0 in

## 2017-07-21 DIAGNOSIS — D582 Other hemoglobinopathies: Secondary | ICD-10-CM | POA: Diagnosis not present

## 2017-07-21 DIAGNOSIS — D72829 Elevated white blood cell count, unspecified: Secondary | ICD-10-CM | POA: Insufficient documentation

## 2017-07-21 DIAGNOSIS — D696 Thrombocytopenia, unspecified: Secondary | ICD-10-CM | POA: Insufficient documentation

## 2017-07-21 LAB — CBC WITH DIFFERENTIAL/PLATELET
Basophils Absolute: 0.1 10*3/uL (ref 0.0–0.1)
Basophils Relative: 1 %
EOS ABS: 0.5 10*3/uL (ref 0.0–0.5)
Eosinophils Relative: 5 %
HCT: 36.3 % — ABNORMAL LOW (ref 38.4–49.9)
Hemoglobin: 12.7 g/dL — ABNORMAL LOW (ref 13.0–17.1)
LYMPHS ABS: 3.1 10*3/uL (ref 0.9–3.3)
Lymphocytes Relative: 31 %
MCH: 27.4 pg (ref 27.2–33.4)
MCHC: 35 g/dL (ref 32.0–36.0)
MCV: 78.2 fL — ABNORMAL LOW (ref 79.3–98.0)
Monocytes Absolute: 0.4 10*3/uL (ref 0.1–0.9)
Monocytes Relative: 5 %
NEUTROS PCT: 58 %
NRBC: 1 /100{WBCs} — AB
Neutro Abs: 5.8 10*3/uL (ref 1.5–6.5)
PLATELETS: 106 10*3/uL — AB (ref 140–400)
RBC: 4.64 MIL/uL (ref 4.20–5.82)
RDW: 17.9 % — ABNORMAL HIGH (ref 11.0–14.6)
WBC: 9.8 10*3/uL (ref 4.0–10.3)

## 2017-07-21 NOTE — Telephone Encounter (Signed)
Scheduled appt per 3/22 los - Gave patient AVS and calender per los.  

## 2017-07-21 NOTE — Progress Notes (Signed)
   [place] OFFICE PROGRESS NOTE   Diagnosis: Hemoglobin C disease  INTERVAL HISTORY:   Larry Berry returns as scheduled.  He feels well.  No complaint.  Objective:  Vital signs in last 24 hours:  Blood pressure (!) 144/96, pulse 61, temperature 98.3 F (36.8 C), temperature source Oral, resp. rate 17, height 6\' 1"  (1.854 m), SpO2 99 %.   Resp: Lungs clear bilaterally Cardio: Regular rate and rhythm GI: No hepatomegaly, the spleen tip is palpable in the left mid abdomen. Vascular: No leg edema   Lab Results:  Lab Results  Component Value Date   WBC 9.8 07/21/2017   HGB 12.7 (L) 07/21/2017   HCT 36.3 (L) 07/21/2017   MCV 78.2 (L) 07/21/2017   PLT 106 (L) 07/21/2017   NEUTROABS 5.8 07/21/2017     Medications: I have reviewed the patient's current medications.   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 2. 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 3. Pneumococcal vaccine - received a 23 valent pneumococcal vaccine in 07/21/2015. He received the 13 valent pneumococcal vaccine 06/30/2014 4. Chronic mild leukocytosis   Disposition: Larry Berry appears stable from a hematologic standpoint.  He will return for an office visit and CBC in 1 year. I recommended he follow-up with Dr. Lysle Rubens to evaluate hypertension. 15 minutes were spent with the patient today.  The majority of the time was used for counseling and coordination of care.  Betsy Coder, MD  07/21/2017  12:02 PM

## 2018-07-18 ENCOUNTER — Telehealth: Payer: Self-pay | Admitting: *Deleted

## 2018-07-18 ENCOUNTER — Telehealth: Payer: Self-pay | Admitting: Oncology

## 2018-07-18 NOTE — Telephone Encounter (Signed)
1 year follow up on 07/20/18. Called patient to cancel his visit on 3/20 and scheduler will call him to reschedule for 8 weeks. He understands and agrees.

## 2018-07-18 NOTE — Telephone Encounter (Signed)
R/s appt per bump list from RN - r/s due to Covid Virus .  Pt aware of new appt date and time

## 2018-07-20 ENCOUNTER — Inpatient Hospital Stay: Payer: BC Managed Care – PPO | Admitting: Oncology

## 2018-07-20 ENCOUNTER — Inpatient Hospital Stay: Payer: BC Managed Care – PPO

## 2018-09-11 ENCOUNTER — Telehealth: Payer: Self-pay | Admitting: Oncology

## 2018-09-11 NOTE — Telephone Encounter (Signed)
Per [facility] reschedule list moved 5/15 lab/fu 2 months out. Left message on both home/cell phone re 7/17 lab/fu. Schedule mailed.

## 2018-09-14 ENCOUNTER — Inpatient Hospital Stay: Payer: BC Managed Care – PPO

## 2018-09-14 ENCOUNTER — Inpatient Hospital Stay: Payer: BC Managed Care – PPO | Admitting: Oncology

## 2018-10-30 ENCOUNTER — Encounter (HOSPITAL_BASED_OUTPATIENT_CLINIC_OR_DEPARTMENT_OTHER): Payer: Self-pay

## 2018-10-30 DIAGNOSIS — R0683 Snoring: Secondary | ICD-10-CM

## 2018-10-30 DIAGNOSIS — G4710 Hypersomnia, unspecified: Secondary | ICD-10-CM

## 2018-10-30 DIAGNOSIS — G4733 Obstructive sleep apnea (adult) (pediatric): Secondary | ICD-10-CM

## 2018-11-16 ENCOUNTER — Inpatient Hospital Stay: Payer: BC Managed Care – PPO | Attending: Oncology

## 2018-11-16 ENCOUNTER — Inpatient Hospital Stay: Payer: BC Managed Care – PPO | Admitting: Oncology

## 2018-11-19 ENCOUNTER — Other Ambulatory Visit (HOSPITAL_COMMUNITY)
Admission: RE | Admit: 2018-11-19 | Discharge: 2018-11-19 | Disposition: A | Discharge status: Home / Self Care | Payer: BC Managed Care – PPO | Source: Ambulatory Visit | Attending: Internal Medicine | Admitting: Internal Medicine

## 2018-11-19 DIAGNOSIS — Z1159 Encounter for screening for other viral diseases: Secondary | ICD-10-CM | POA: Insufficient documentation

## 2018-11-20 LAB — SARS CORONAVIRUS 2 (TAT 6-24 HRS): SARS Coronavirus 2: NEGATIVE

## 2018-11-21 ENCOUNTER — Other Ambulatory Visit: Payer: Self-pay

## 2018-11-21 ENCOUNTER — Ambulatory Visit (HOSPITAL_BASED_OUTPATIENT_CLINIC_OR_DEPARTMENT_OTHER): Payer: BC Managed Care – PPO | Attending: Internal Medicine | Admitting: Internal Medicine

## 2018-11-21 DIAGNOSIS — G4710 Hypersomnia, unspecified: Secondary | ICD-10-CM

## 2018-11-21 DIAGNOSIS — G4733 Obstructive sleep apnea (adult) (pediatric): Secondary | ICD-10-CM

## 2018-11-21 DIAGNOSIS — R0683 Snoring: Secondary | ICD-10-CM

## 2018-11-26 NOTE — Procedures (Signed)
   NAME: Larry Berry DATE OF BIRTH:  1960/09/27 MEDICAL RECORD NUMBER [id number]  LOCATION: [facility]  PHYSICIAN: Marius Ditch  DATE OF STUDY: 11/21/2018  SLEEP STUDY TYPE: Positive Airway Pressure Titration               REFERRING PHYSICIAN: Marius Ditch, MD  INDICATION FOR STUDY: CSA and OSA noted on HSAT. Total AHI 66/hr, 50% appeared to be centrals.   EPWORTH SLEEPINESS SCORE:   HEIGHT: 6\' 1"  (185.4 cm)  WEIGHT: 247 lb (112 kg)    Body mass index is 32.59 kg/m.  NECK SIZE: 17 in.  MEDICATIONS  Patient self administered medications include: N/A. Medications administered during study include No sleep medicine administered.Marland Kitchen   SLEEP STUDY TECHNIQUE  The patient underwent an attended overnight polysomnography titration to assess the effects of cpap therapy. The following variables were monitored: EEG(C4-A1, C3-A2, O1-A2, O2-A1), EOG, submental and leg EMG, ECG, oxyhemoglobin saturation by pulse oximetry, thoracic and abdominal respiratory effort belts, nasal/oral airflow by pressure sensor, body position sensor and snoring sensor. CPAP pressure was titrated to eliminate apneas, hypopneas and oxygen desaturation.   TECHNICAL COMMENTS  Comments added by Technician: PATIENT WAS ORDERED AS A CPAP TITRATION. Comments added by Scorer: N/A   SLEEP ARCHITECTURE  The study was initiated at 10:24:24 PM and terminated at 4:46:48 AM. Total recorded time was 382.4 minutes. EEG confirmed total sleep time was 230.5 minutes yielding a sleep efficiency of 60.3%%. Sleep onset after lights out was 41.4 minutes with a REM latency of 53.5 minutes. The patient spent 23.0%% of the night in stage N1 sleep, 55.5%% in stage N2 sleep, 0.0%% in stage N3 and 21.5% in REM. The Arousal Index was 34.9/hour.   RESPIRATORY PARAMETERS  The overall AHI was 8.9 per hour, and the RDI was 29.4 events/hour with a central apnea index of 1.0per hour. Pressure was increased to IPAP/EPAP 17/17  cm H2O. At this setting, the sleep efficiency was 91% and the patient was supine for 0%. The AHI was 0.0 events per hour, and the RDI was 3.2 events/hour (with 1.0 central events) and the arousal index was 5.3 per hour.The oxygen nadir was 94.0% during sleep. However, lower pressures also afforded acceptable airway control.   LEG MOVEMENT DATA  The total leg movements were 0 with a resulting leg movement index of 0.0/hr. Associated arousal with leg movement index was 0.0/hr.   CARDIAC DATA  The underlying cardiac rhythm was most consistent with sinus rhythm. Mean heart rate during sleep was 62.2 bpm. Additional rhythm abnormalities include None.   IMPRESSIONS  Severe OSA and possible CSA based on HSAT prior to CPAP titration No significant Central Sleep Apnea (CSA) emerged during this study  No significant periodic leg movements(PLMs) during sleep.   DIAGNOSIS  Obstructive Sleep Apnea (327.23 [G47.33 ICD-10])  RECOMMENDATIONS  Due to relatively good airway control at a variety of pressures, APAP is suggested with a range of 4-20. If this does not prove successful, the CPAP therapy at17 cm H2O would be appropriate. The patient used a Wide size Resmed Nasal Mask Mir[place] FX mask and heated humidification.  Marius Ditch Sleep specialist, [place] Board of Internal Medicine  ELECTRONICALLY SIGNED ON:  11/26/2018, 8:35 PM [place] PH: [phone]   FX: [phone] [place]

## 2019-09-23 ENCOUNTER — Ambulatory Visit
Admission: RE | Admit: 2019-09-23 | Discharge: 2019-09-23 | Disposition: A | Discharge status: Home / Self Care | Payer: BC Managed Care – PPO | Source: Ambulatory Visit | Attending: Internal Medicine | Admitting: Internal Medicine

## 2019-09-23 ENCOUNTER — Other Ambulatory Visit: Payer: Self-pay | Admitting: Internal Medicine

## 2019-09-23 DIAGNOSIS — M542 Cervicalgia: Secondary | ICD-10-CM

## 2019-09-23 DIAGNOSIS — M25512 Pain in left shoulder: Secondary | ICD-10-CM

## 2019-12-13 ENCOUNTER — Telehealth: Payer: Self-pay | Admitting: Oncology

## 2019-12-13 NOTE — Telephone Encounter (Signed)
Scheduled appt per 8/13 sch msg - pt is aware of apt date and time

## 2020-01-10 ENCOUNTER — Other Ambulatory Visit: Payer: Self-pay | Admitting: *Deleted

## 2020-01-10 DIAGNOSIS — D582 Other hemoglobinopathies: Secondary | ICD-10-CM

## 2020-01-13 ENCOUNTER — Inpatient Hospital Stay: Payer: BC Managed Care – PPO | Attending: Oncology | Admitting: Oncology

## 2020-01-13 ENCOUNTER — Other Ambulatory Visit: Payer: Self-pay | Admitting: Oncology

## 2020-01-13 ENCOUNTER — Other Ambulatory Visit: Payer: Self-pay

## 2020-01-13 ENCOUNTER — Inpatient Hospital Stay: Payer: BC Managed Care – PPO

## 2020-01-13 VITALS — BP 120/82 | HR 62 | Temp 97.2°F | Resp 20 | Ht 73.0 in | Wt 246.6 lb

## 2020-01-13 DIAGNOSIS — D696 Thrombocytopenia, unspecified: Secondary | ICD-10-CM | POA: Insufficient documentation

## 2020-01-13 DIAGNOSIS — Z23 Encounter for immunization: Secondary | ICD-10-CM

## 2020-01-13 DIAGNOSIS — D731 Hypersplenism: Secondary | ICD-10-CM | POA: Insufficient documentation

## 2020-01-13 DIAGNOSIS — D582 Other hemoglobinopathies: Secondary | ICD-10-CM

## 2020-01-13 DIAGNOSIS — D72829 Elevated white blood cell count, unspecified: Secondary | ICD-10-CM | POA: Insufficient documentation

## 2020-01-13 LAB — CBC WITH DIFFERENTIAL (CANCER CENTER ONLY)
Abs Immature Granulocytes: 0.04 10*3/uL (ref 0.00–0.07)
Basophils Absolute: 0.1 10*3/uL (ref 0.0–0.1)
Basophils Relative: 1 %
Eosinophils Absolute: 0.5 10*3/uL (ref 0.0–0.5)
Eosinophils Relative: 5 %
HCT: 37.4 % — ABNORMAL LOW (ref 39.0–52.0)
Hemoglobin: 13.4 g/dL (ref 13.0–17.0)
Immature Granulocytes: 0 %
Lymphocytes Relative: 34 %
Lymphs Abs: 3.7 10*3/uL (ref 0.7–4.0)
MCH: 27.6 pg (ref 26.0–34.0)
MCHC: 35.8 g/dL (ref 30.0–36.0)
MCV: 77 fL — ABNORMAL LOW (ref 80.0–100.0)
Monocytes Absolute: 0.5 10*3/uL (ref 0.1–1.0)
Monocytes Relative: 5 %
Neutro Abs: 5.9 10*3/uL (ref 1.7–7.7)
Neutrophils Relative %: 55 %
Platelet Count: 125 10*3/uL — ABNORMAL LOW (ref 150–400)
RBC: 4.86 MIL/uL (ref 4.22–5.81)
RDW: 16.9 % — ABNORMAL HIGH (ref 11.5–15.5)
WBC Count: 10.7 10*3/uL — ABNORMAL HIGH (ref 4.0–10.5)
nRBC: 1.1 % — ABNORMAL HIGH (ref 0.0–0.2)

## 2020-01-13 MED ORDER — PNEUMOCOCCAL VAC POLYVALENT 25 MCG/0.5ML IJ INJ: 0.5000 mL | INJECTION | Freq: Once | INTRAMUSCULAR | Status: DC

## 2020-01-13 NOTE — Progress Notes (Signed)
  [place] OFFICE PROGRESS NOTE   Diagnosis: Hemoglobin C disease  INTERVAL HISTORY:   Mr. Sprung returns as scheduled.  He feels well.  He reports discomfort in the left shoulder and upper arm since May of this year.  He saw Dr. Lysle Rubens.  The pain initially improved with medication prescribed by Dr. Lysle Rubens, but has returned.  No known trauma. He received the second COVID-19 vaccine in July. Objective:  Vital signs in last 24 hours:  Blood pressure 120/82, pulse 62, temperature (!) 97.2 F (36.2 C), temperature source Tympanic, resp. rate 20, height 6\' 1"  (1.854 m), weight 246 lb 9.6 oz (111.9 kg), SpO2 97 %.    Lymphatics: No left axillary nodes Resp: Lungs clear bilaterally Cardio: Regular rate and rhythm GI: No hepatomegaly, the spleen tip is palpable in the left mid abdomen. Vascular: No leg edema Neuro: The motor exam appears intact in the hands bilaterally Musculoskeletal: No pain with motion at the left shoulder.  No tenderness of the left upper arm or shoulder.   Lab Results:  Lab Results  Component Value Date   WBC 10.7 (H) 01/13/2020   HGB 13.4 01/13/2020   HCT 37.4 (L) 01/13/2020   MCV 77.0 (L) 01/13/2020   PLT 125 (L) 01/13/2020   NEUTROABS 5.9 01/13/2020     Medications: I have reviewed the patient's current medications.   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 2. 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 3. Pneumococcal vaccine -received a 23 valent pneumococcal vaccine in 07/21/2015. He received the 13 valent pneumococcal vaccine 06/30/2014 4. Chronic mild leukocytosis    Disposition: Larry Berry is stable from a hematologic standpoint.  He will return for an office visit and CBC in 1 year.  He will return in 6 months for a 23 valent pneumococcal vaccine.  He will obtain a COVID-19 booster vaccine when available.  He will obtain an influenza vaccine via Dr.  Lysle Rubens.  The left shoulder/arm discomfort is likely related to a benign musculoskeletal condition.  I recommended he follow-up with Dr. Lysle Rubens.  Betsy Coder, MD  01/13/2020  1:17 PM

## 2021-01-12 ENCOUNTER — Inpatient Hospital Stay: Payer: BC Managed Care – PPO

## 2021-01-12 ENCOUNTER — Other Ambulatory Visit: Payer: Self-pay

## 2021-01-12 ENCOUNTER — Other Ambulatory Visit: Payer: BC Managed Care – PPO

## 2021-01-12 ENCOUNTER — Inpatient Hospital Stay: Payer: BC Managed Care – PPO | Attending: Oncology | Admitting: Oncology

## 2021-01-12 VITALS — BP 129/90 | HR 65 | Temp 98.1°F | Resp 18 | Ht 73.0 in | Wt 256.8 lb

## 2021-01-12 DIAGNOSIS — D72829 Elevated white blood cell count, unspecified: Secondary | ICD-10-CM | POA: Diagnosis not present

## 2021-01-12 DIAGNOSIS — D582 Other hemoglobinopathies: Secondary | ICD-10-CM

## 2021-01-12 DIAGNOSIS — D696 Thrombocytopenia, unspecified: Secondary | ICD-10-CM | POA: Insufficient documentation

## 2021-01-12 LAB — CBC WITH DIFFERENTIAL (CANCER CENTER ONLY)
Abs Immature Granulocytes: 0.07 10*3/uL (ref 0.00–0.07)
Basophils Absolute: 0.1 10*3/uL (ref 0.0–0.1)
Basophils Relative: 1 %
Eosinophils Absolute: 0.6 10*3/uL — ABNORMAL HIGH (ref 0.0–0.5)
Eosinophils Relative: 5 %
HCT: 39.6 % (ref 39.0–52.0)
Hemoglobin: 13.9 g/dL (ref 13.0–17.0)
Immature Granulocytes: 1 %
Lymphocytes Relative: 29 %
Lymphs Abs: 3.2 10*3/uL (ref 0.7–4.0)
MCH: 27.4 pg (ref 26.0–34.0)
MCHC: 35.1 g/dL (ref 30.0–36.0)
MCV: 78.1 fL — ABNORMAL LOW (ref 80.0–100.0)
Monocytes Absolute: 0.5 10*3/uL (ref 0.1–1.0)
Monocytes Relative: 5 %
Neutro Abs: 6.7 10*3/uL (ref 1.7–7.7)
Neutrophils Relative %: 59 %
Platelet Count: 119 10*3/uL — ABNORMAL LOW (ref 150–400)
RBC: 5.07 MIL/uL (ref 4.22–5.81)
RDW: 17 % — ABNORMAL HIGH (ref 11.5–15.5)
WBC Count: 11.1 10*3/uL — ABNORMAL HIGH (ref 4.0–10.5)
nRBC: 1.2 % — ABNORMAL HIGH (ref 0.0–0.2)

## 2021-01-12 MED ORDER — PNEUMOCOCCAL VAC POLYVALENT 25 MCG/0.5ML IJ INJ: 0.5000 mL | INJECTION | Freq: Once | INTRAMUSCULAR | Status: DC

## 2021-01-12 NOTE — Progress Notes (Signed)
  [place] OFFICE PROGRESS NOTE   Diagnosis: Hemoglobin C disease  INTERVAL HISTORY:   Larry Berry returns as scheduled.  He feels well.  He is not bothered by splenomegaly.  He has no specific complaint.  Objective:  Vital signs in last 24 hours:  Blood pressure 129/90, pulse 65, temperature 98.1 F (36.7 C), temperature source Oral, resp. rate 18, height '6\' 1"'$  (1.854 m), weight 256 lb 12.8 oz (116.5 kg), SpO2 98 %.    Resp: Lungs clear bilaterally Cardio: Regular rate and rhythm GI: The spleen is palpable in the left mid abdomen, no hepatomegaly Vascular: No leg edema   Lab Results:  Lab Results  Component Value Date   WBC 11.1 (H) 01/12/2021   HGB 13.9 01/12/2021   HCT 39.6 01/12/2021   MCV 78.1 (L) 01/12/2021   PLT 119 (L) 01/12/2021   NEUTROABS 6.7 01/12/2021     Medications: I have reviewed the patient's current medications.   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 Pneumococcal vaccine - received a 23 valent pneumococcal vaccine 01/12/2021.  He received the 13 valent pneumococcal vaccine 06/30/2014 Chronic mild leukocytosis     Disposition: Larry Berry appears unchanged.  He is stable from a hematologic standpoint.  He will continue follow-up in the hematology clinic.  He will return for an office visit in 1 year.  He received a 23 valent pneumococcal vaccine today.  He will stay up-to-date on the influenza vaccine and plans to obtain a COVID-19 booster vaccine this fall.  Betsy Coder, MD  01/12/2021  12:20 PM

## 2021-03-10 IMAGING — CR DG SHOULDER 2+V*L*
3 series · 3 of 3 positions shown · non-contrast
Comparison: None.

CLINICAL DATA: Left shoulder pain

EXAM:
LEFT SHOULDER - 2+ VIEW

[w shoulder ap internal left]
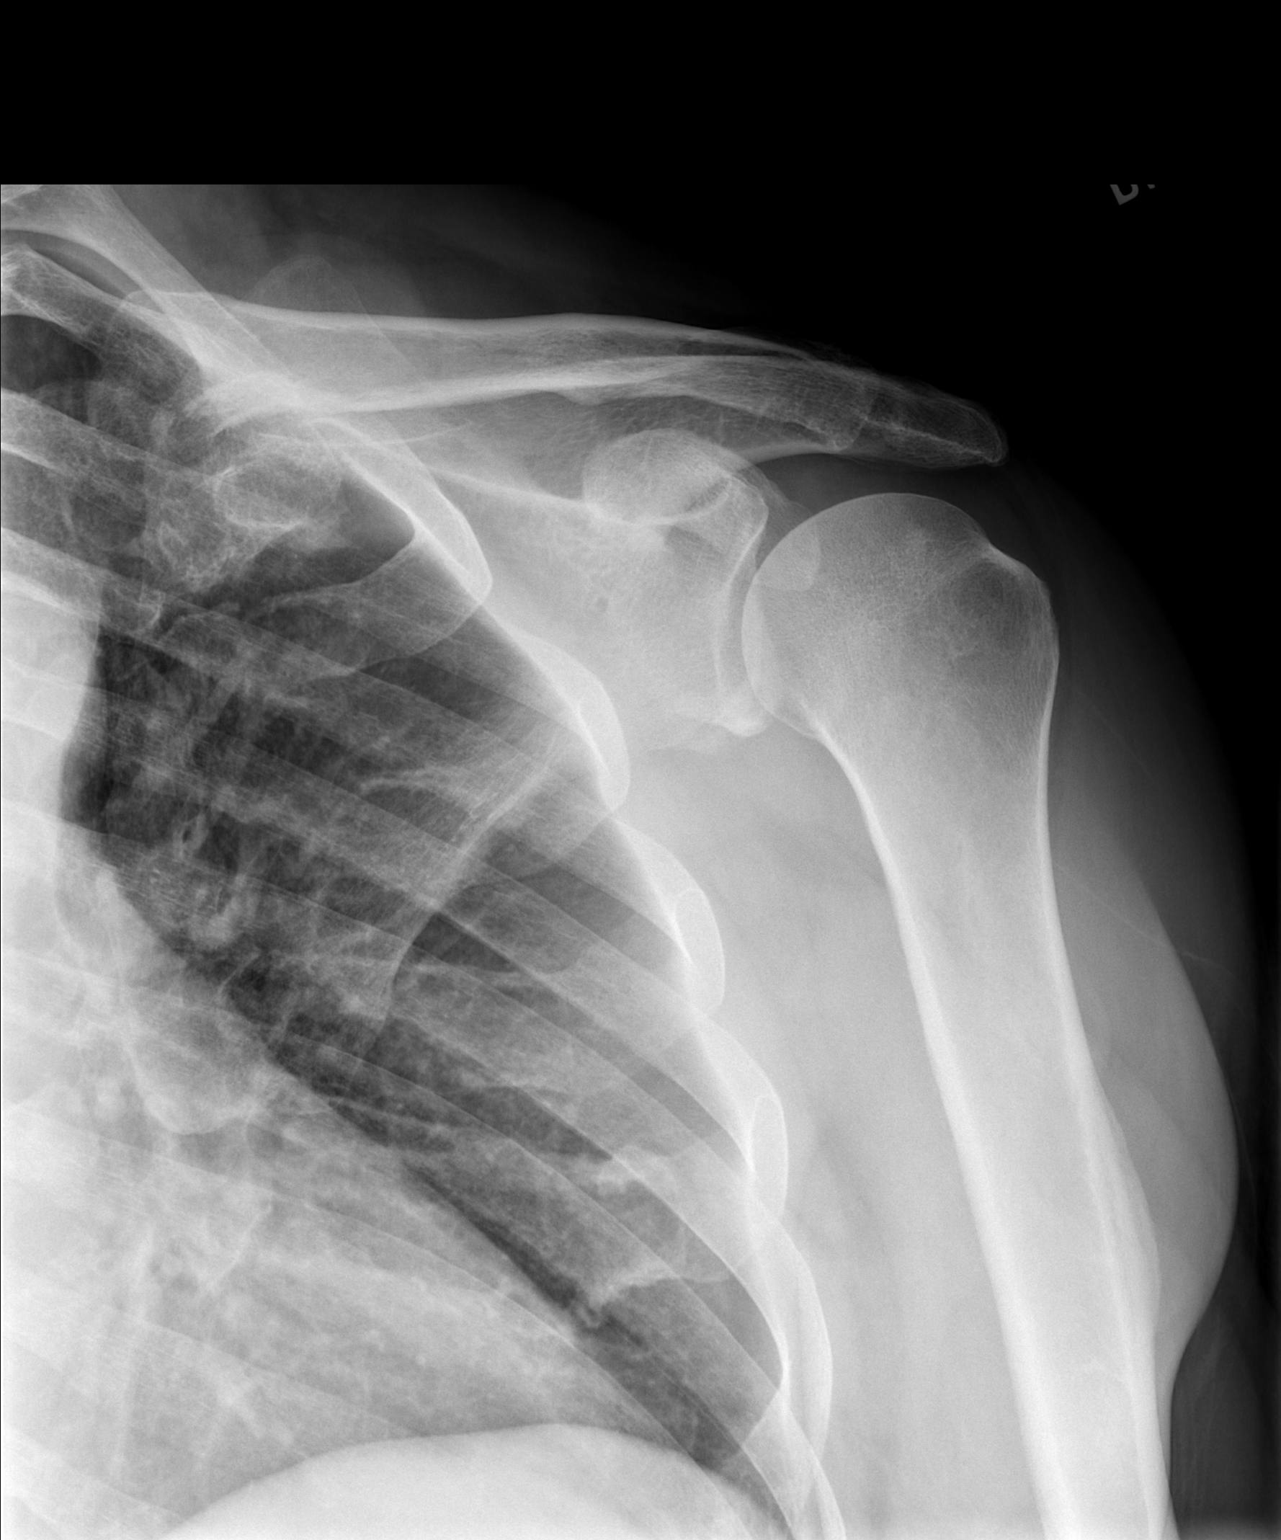

[w shoulder y view left]
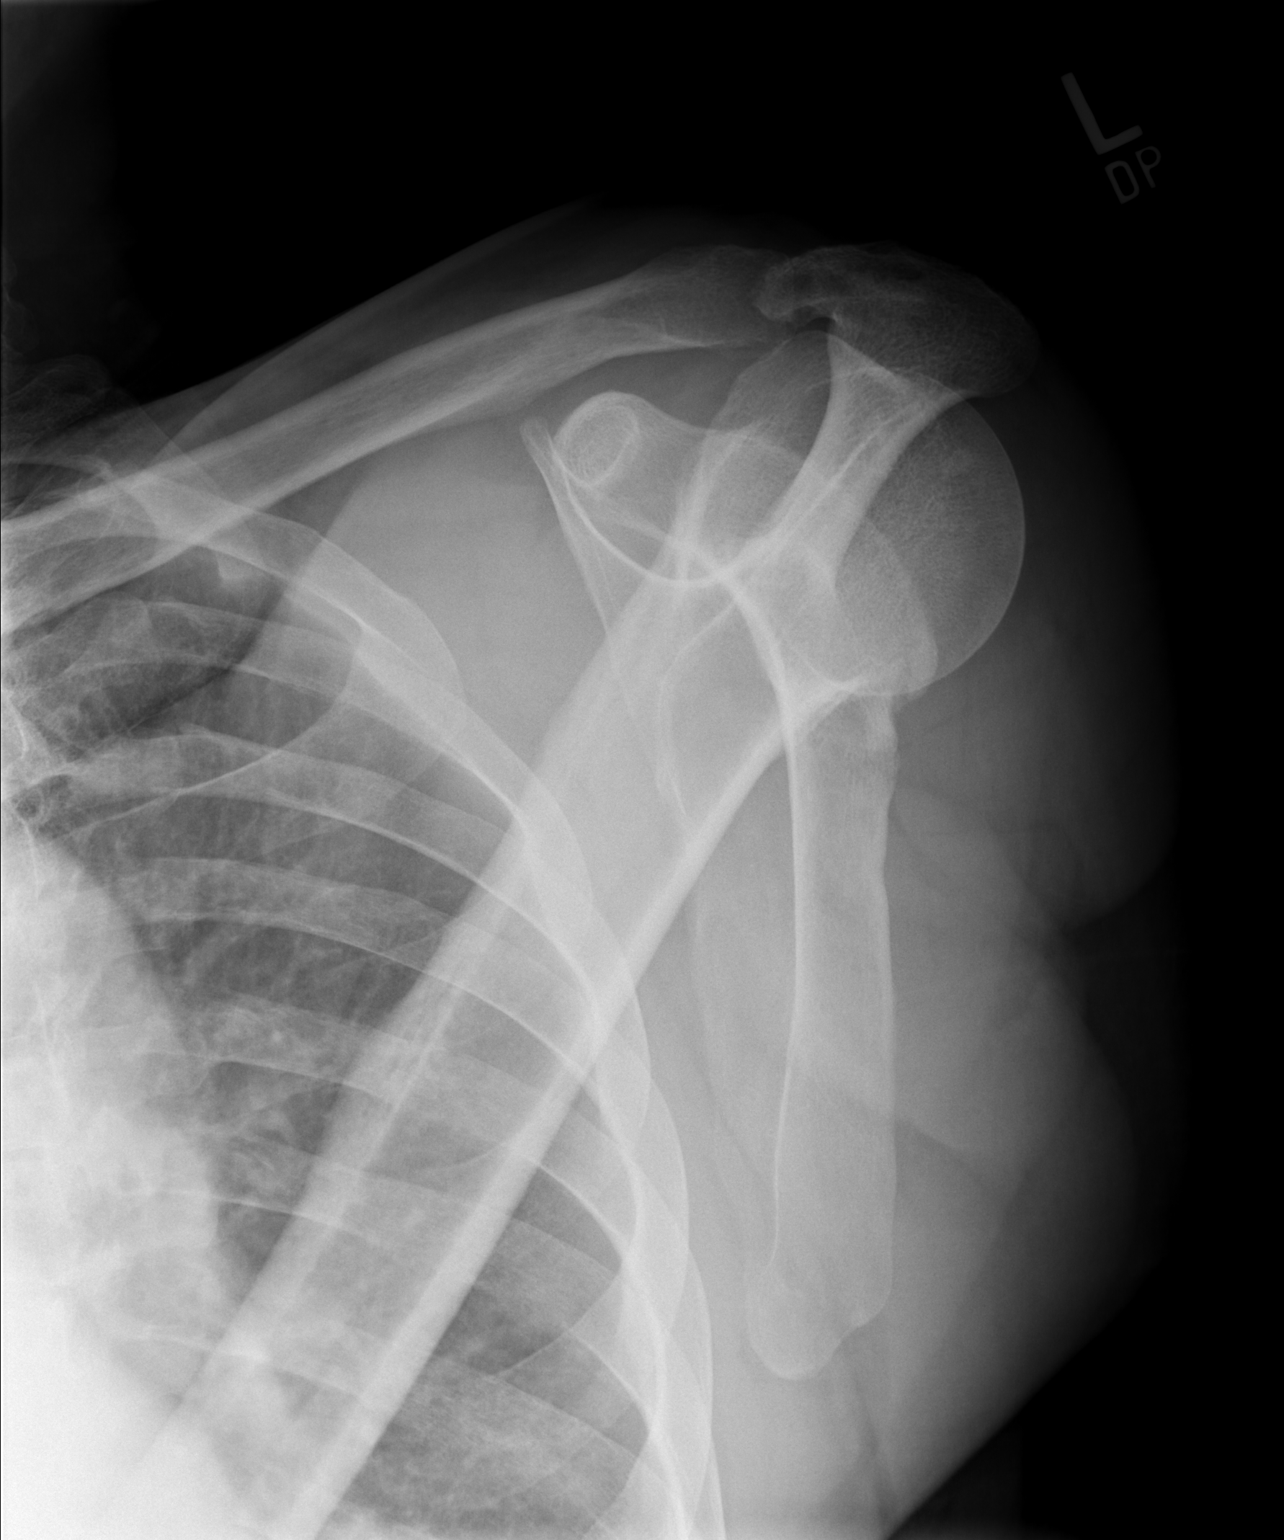

[w shoulder axillary left *]
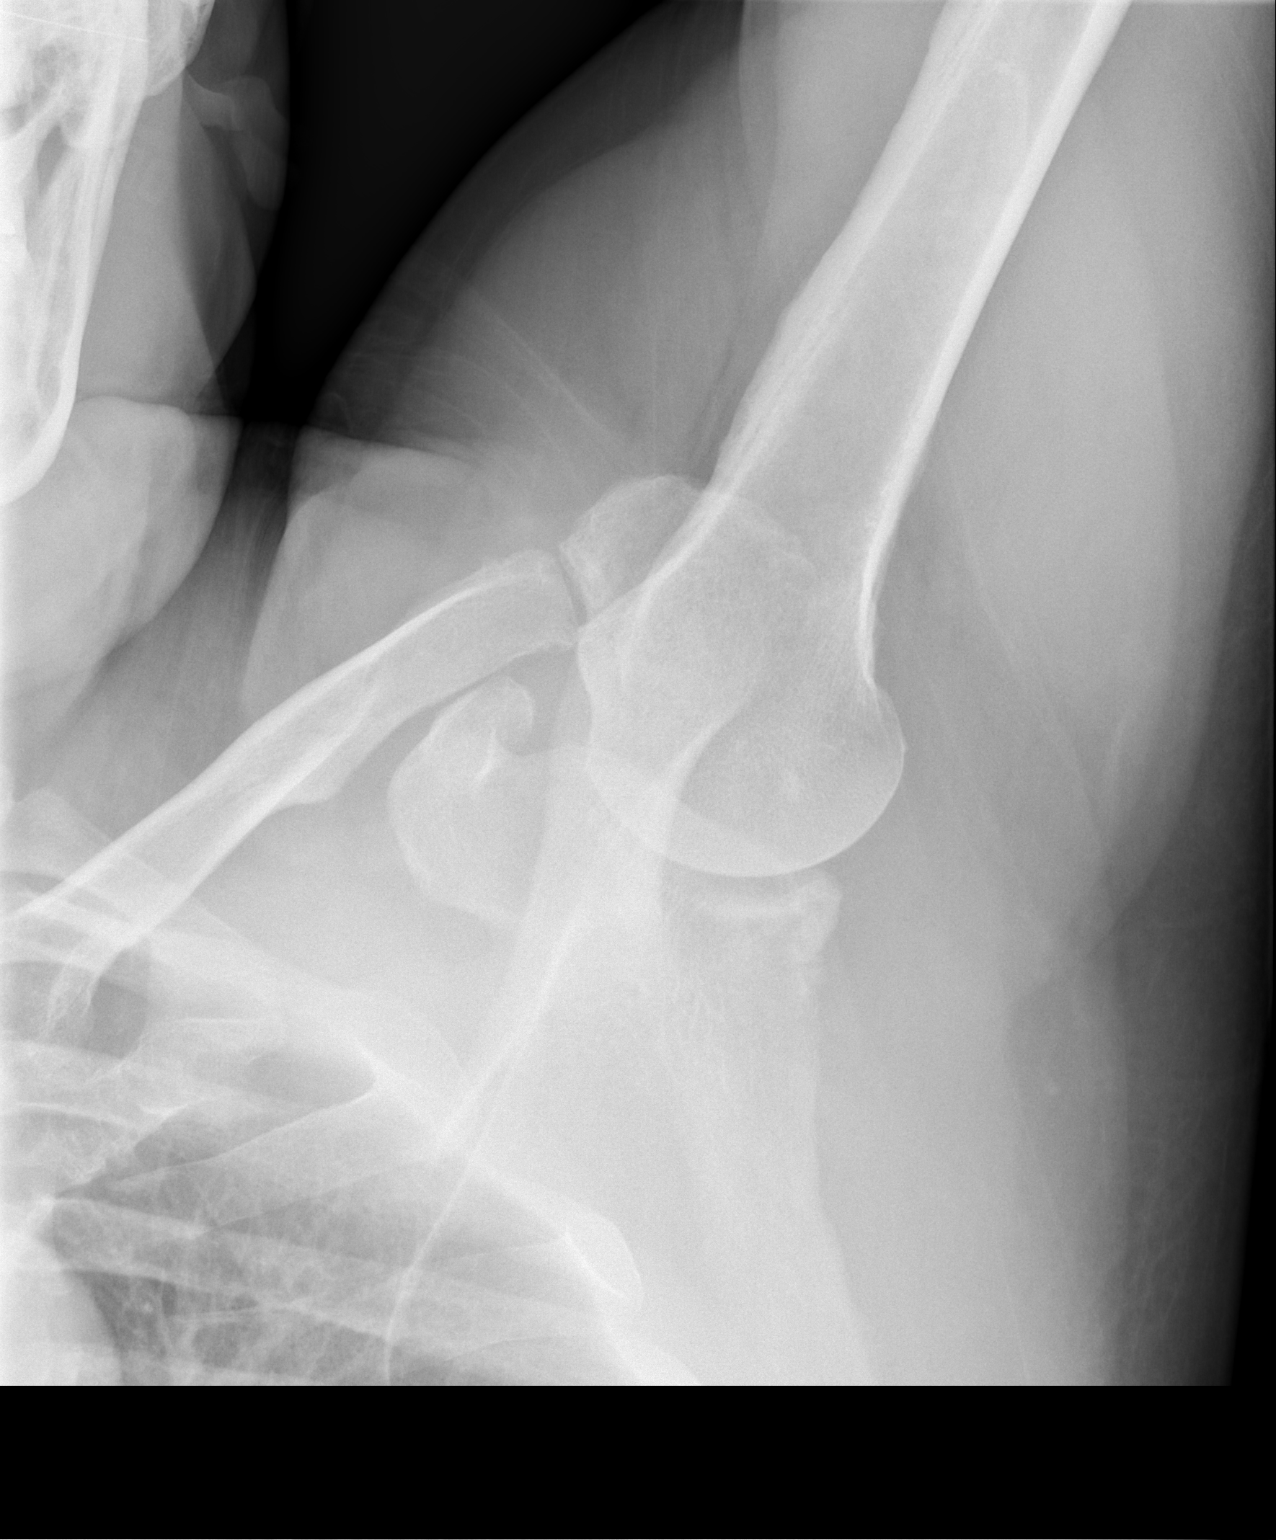

[3 of 3 positions shown; findings below may reference images not displayed]

FINDINGS: There is no evidence of fracture or dislocation. Mild AC joint
degenerative change.
IMPRESSION: Mild AC joint degenerative change

## 2021-03-10 IMAGING — CR DG CERVICAL SPINE 2 OR 3 VIEWS
3 series · 3 of 3 positions shown · non-contrast
Comparison: None.

CLINICAL DATA: Neck pain

EXAM:
CERVICAL SPINE - 2-3 VIEW

[w c-spine lat]
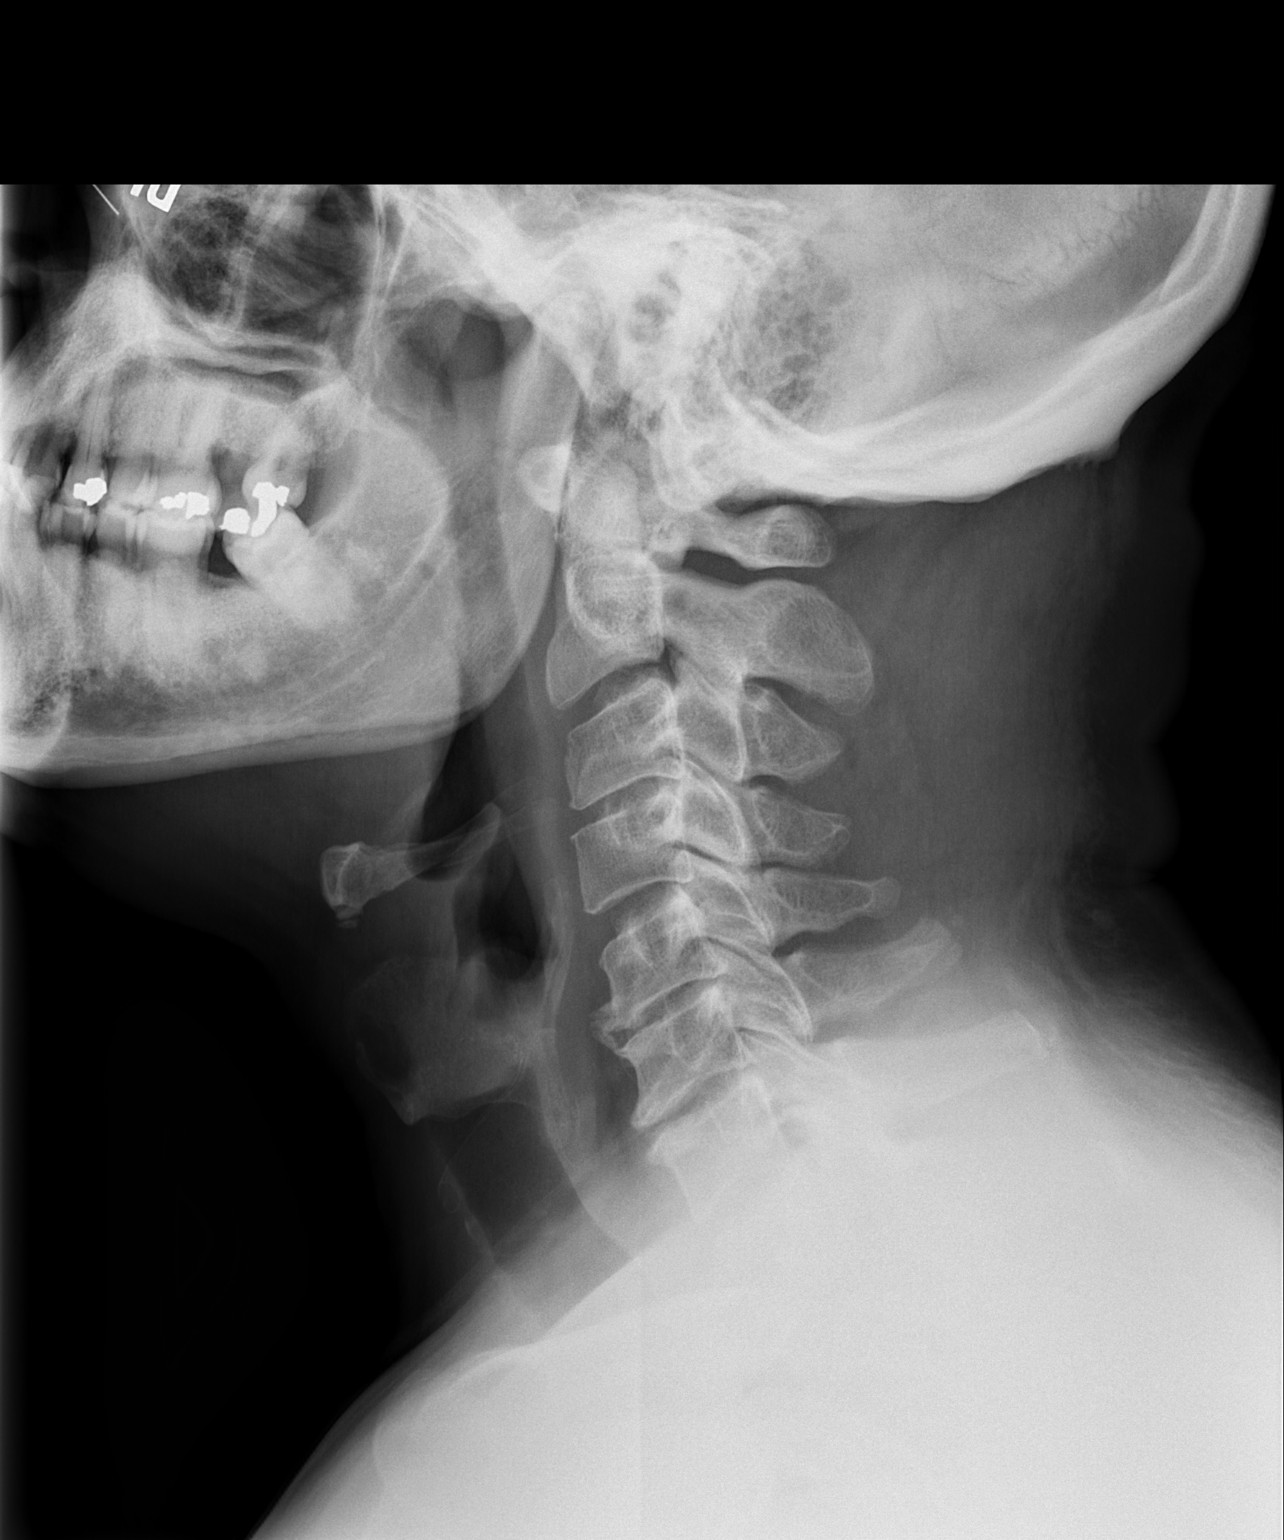

[w c-spine a.p.]
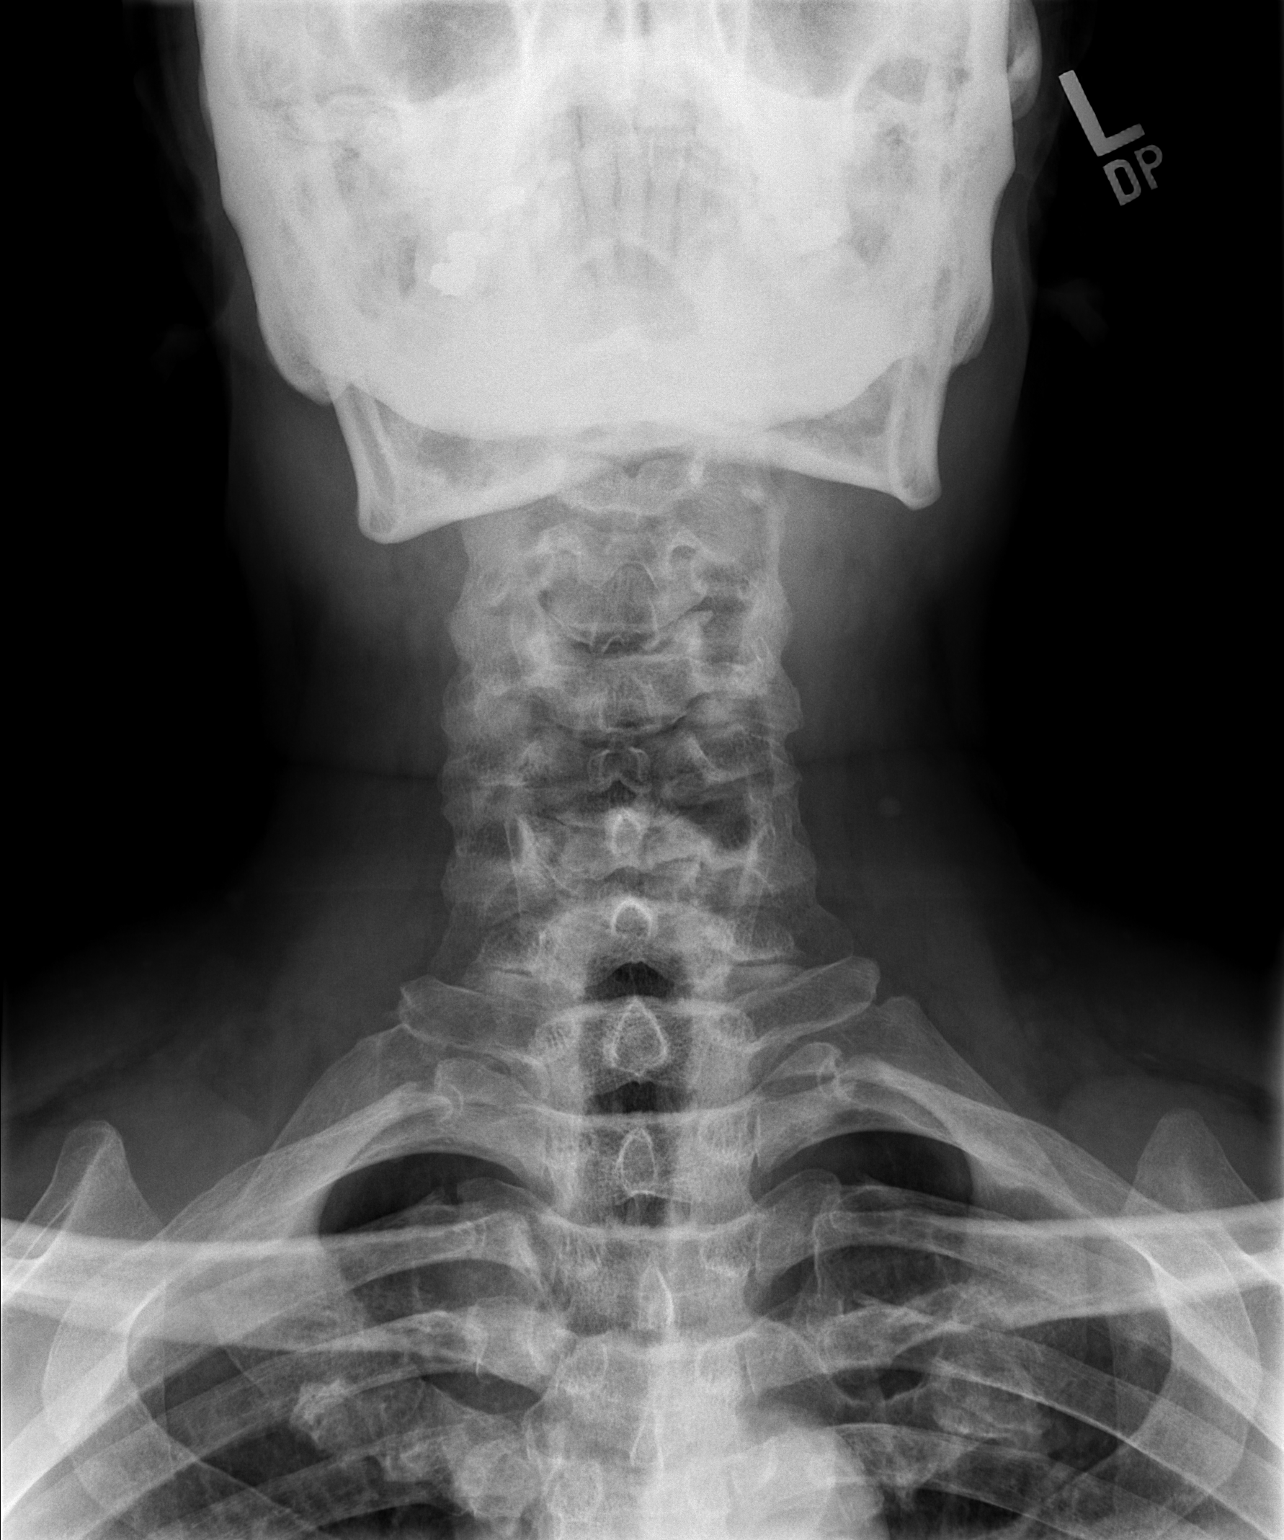

[w c-spine odontoid]
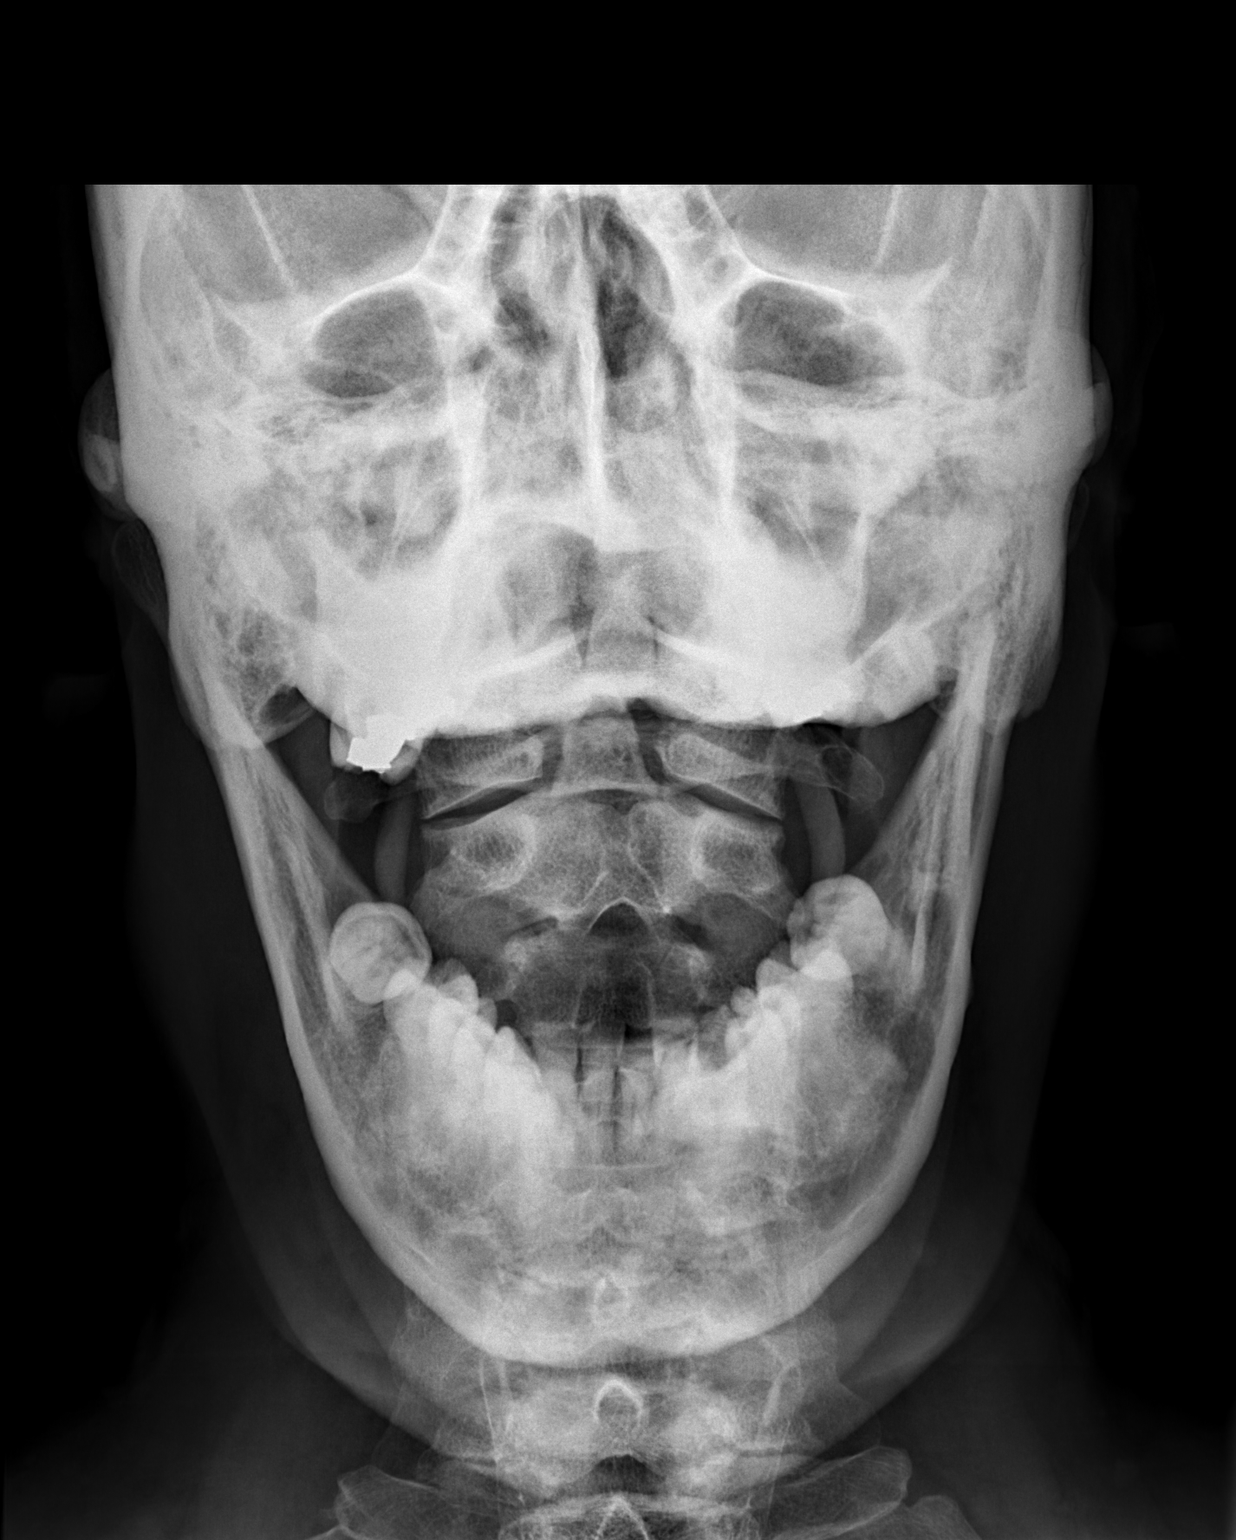

[3 of 3 positions shown; findings below may reference images not displayed]

FINDINGS: Straightening of the cervical spine. Trace retrolisthesis C4 on C5.
Moderate degenerative changes C5-C6 and C6-C7. Mild disc space
narrowing at C3-C4. Normal prevertebral soft tissue thickness. Dens
and lateral masses are within normal limits
IMPRESSION: Straightening with trace retrolisthesis C4 on C5. Multiple level
degenerative change most advanced at C5-C6 and C6-C7

## 2021-12-06 ENCOUNTER — Telehealth: Payer: Self-pay | Admitting: Oncology

## 2021-12-06 NOTE — Telephone Encounter (Signed)
Attempted to contact patient in regards to rescheduling appointments due to Dr. Benay Spice being out of office 9/8-9/15.

## 2022-01-12 ENCOUNTER — Ambulatory Visit: Payer: Self-pay | Admitting: Oncology

## 2022-01-12 ENCOUNTER — Other Ambulatory Visit: Payer: Self-pay

## 2022-01-19 ENCOUNTER — Other Ambulatory Visit: Payer: Self-pay | Admitting: *Deleted

## 2022-01-19 DIAGNOSIS — D582 Other hemoglobinopathies: Secondary | ICD-10-CM

## 2022-01-26 ENCOUNTER — Inpatient Hospital Stay (HOSPITAL_BASED_OUTPATIENT_CLINIC_OR_DEPARTMENT_OTHER): Payer: BC Managed Care – PPO | Admitting: Oncology

## 2022-01-26 ENCOUNTER — Other Ambulatory Visit (HOSPITAL_BASED_OUTPATIENT_CLINIC_OR_DEPARTMENT_OTHER): Payer: Self-pay

## 2022-01-26 ENCOUNTER — Inpatient Hospital Stay: Payer: BC Managed Care – PPO | Attending: Oncology

## 2022-01-26 VITALS — BP 121/85 | HR 65 | Temp 98.1°F | Resp 18 | Ht 73.0 in | Wt 246.2 lb

## 2022-01-26 DIAGNOSIS — R161 Splenomegaly, not elsewhere classified: Secondary | ICD-10-CM | POA: Diagnosis not present

## 2022-01-26 DIAGNOSIS — D582 Other hemoglobinopathies: Secondary | ICD-10-CM

## 2022-01-26 DIAGNOSIS — D72829 Elevated white blood cell count, unspecified: Secondary | ICD-10-CM | POA: Insufficient documentation

## 2022-01-26 DIAGNOSIS — D696 Thrombocytopenia, unspecified: Secondary | ICD-10-CM | POA: Insufficient documentation

## 2022-01-26 LAB — CBC WITH DIFFERENTIAL (CANCER CENTER ONLY)
Abs Immature Granulocytes: 0.04 10*3/uL (ref 0.00–0.07)
Basophils Absolute: 0.1 10*3/uL (ref 0.0–0.1)
Basophils Relative: 1 %
Eosinophils Absolute: 0.7 10*3/uL — ABNORMAL HIGH (ref 0.0–0.5)
Eosinophils Relative: 7 %
HCT: 37.3 % — ABNORMAL LOW (ref 39.0–52.0)
Hemoglobin: 13.2 g/dL (ref 13.0–17.0)
Immature Granulocytes: 0 %
Lymphocytes Relative: 33 %
Lymphs Abs: 3.1 10*3/uL (ref 0.7–4.0)
MCH: 26.6 pg (ref 26.0–34.0)
MCHC: 35.4 g/dL (ref 30.0–36.0)
MCV: 75.1 fL — ABNORMAL LOW (ref 80.0–100.0)
Monocytes Absolute: 0.4 10*3/uL (ref 0.1–1.0)
Monocytes Relative: 5 %
Neutro Abs: 5.2 10*3/uL (ref 1.7–7.7)
Neutrophils Relative %: 54 %
Platelet Count: 112 10*3/uL — ABNORMAL LOW (ref 150–400)
RBC: 4.97 MIL/uL (ref 4.22–5.81)
RDW: 16.5 % — ABNORMAL HIGH (ref 11.5–15.5)
WBC Count: 9.6 10*3/uL (ref 4.0–10.5)
nRBC: 0.7 % — ABNORMAL HIGH (ref 0.0–0.2)

## 2022-01-26 MED ORDER — FLUARIX QUADRIVALENT 0.5 ML IM SUSY
PREFILLED_SYRINGE | INTRAMUSCULAR | 0 refills | Status: DC
  Filled 2022-01-26: qty 0.5, 1d supply, fill #0

## 2022-01-26 NOTE — Progress Notes (Signed)
  [place] OFFICE PROGRESS NOTE   Diagnosis: Hemoglobin C disease  INTERVAL HISTORY:   Larry Berry returns as scheduled.  He feels well.  He is working.  Reports intentional weight loss by changing his diet.  He notes splenomegaly in the left upper abdomen.  No pain.  He reports urinary frequency and hesitancy.  Objective:  Vital signs in last 24 hours:  Blood pressure 121/85, pulse 65, temperature 98.1 F (36.7 C), temperature source Oral, resp. rate 18, height '6\' 1"'$  (1.854 m), weight 246 lb 3.2 oz (111.7 kg), SpO2 100 %.  Resp: Lungs clear bilaterally Cardio: Regular rate and rhythm GI: No hepatomegaly.  The spleen is palpable in the left upper abdomen Vascular: No leg edema     Lab Results:  Lab Results  Component Value Date   WBC 9.6 01/26/2022   HGB 13.2 01/26/2022   HCT 37.3 (L) 01/26/2022   MCV 75.1 (L) 01/26/2022   PLT 112 (L) 01/26/2022   NEUTROABS 5.2 01/26/2022     Medications: I have reviewed the patient's current medications.   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 Pneumococcal vaccine - received a 23 valent pneumococcal vaccine 01/12/2021.  He received the 13 valent pneumococcal vaccine 06/30/2014 Chronic mild leukocytosis     Disposition: Larry Berry appears stable from a hematologic standpoint.  He is asymptomatic from the hemoglobin C disease.  He will remain up-to-date on influenza and COVID-19 vaccines.  He will be eligible for a 20 valent pneumococcal vaccine in a few years.  He would like to continue follow-up in the hematology clinic.  He will return for an office visit in 1 year.  I recommended he follow-up with Dr. Lysle Rubens for evaluation of the urinary symptoms.  Betsy Coder, MD  01/26/2022  11:17 AM

## 2022-03-23 ENCOUNTER — Encounter (HOSPITAL_BASED_OUTPATIENT_CLINIC_OR_DEPARTMENT_OTHER): Payer: Self-pay | Admitting: Emergency Medicine

## 2022-03-23 ENCOUNTER — Emergency Department (HOSPITAL_BASED_OUTPATIENT_CLINIC_OR_DEPARTMENT_OTHER)
Admission: EM | Admit: 2022-03-23 | Discharge: 2022-03-23 | Disposition: A | Discharge status: Home / Self Care | Payer: BC Managed Care – PPO | Attending: Emergency Medicine | Admitting: Emergency Medicine

## 2022-03-23 ENCOUNTER — Other Ambulatory Visit: Payer: Self-pay

## 2022-03-23 DIAGNOSIS — R0981 Nasal congestion: Secondary | ICD-10-CM | POA: Diagnosis present

## 2022-03-23 DIAGNOSIS — U071 COVID-19: Secondary | ICD-10-CM | POA: Diagnosis not present

## 2022-03-23 LAB — RESP PANEL BY RT-PCR (FLU A&B, COVID) ARPGX2
Influenza A by PCR: NEGATIVE
Influenza B by PCR: NEGATIVE
SARS Coronavirus 2 by RT PCR: POSITIVE — AB

## 2022-03-23 MED ORDER — FLUTICASONE PROPIONATE 50 MCG/ACT NA SUSP: 1.0000 | Freq: Every day | NASAL | 2 refills | Status: DC

## 2022-03-23 MED ORDER — BENZONATATE 100 MG PO CAPS: 100.0000 mg | ORAL_CAPSULE | Freq: Three times a day (TID) | ORAL | 0 refills | Status: DC

## 2022-03-23 NOTE — ED Provider Notes (Signed)
[place] EMERGENCY DEPT Provider Note   CSN: [id number] Arrival date & time: 03/23/22  2004     History  No chief complaint on file.   Larry Berry is a 61 y.o. male.  Patient with no pertinent past medical history presents today with complaints of headache, body aches, and congestion.  He states that same began yesterday and has been persistent throughout today.  He has not tried any medications for his symptoms.  Denies any known sick contacts, however does state that he was at a football game yesterday and someone around him was coughing.  He denies any fevers or chills.  Denies any chest pain or shortness of breath.  The history is provided by the patient. No language interpreter was used.       Home Medications Prior to Admission medications   Medication Sig Start Date End Date Taking? Authorizing Provider  acetaminophen (TYLENOL) 500 MG tablet Take 1,000 mg by mouth every 6 (six) hours as needed for mild pain.    [provider]  influenza vac split quadrivalent PF (FLUARIX QUADRIVALENT) 0.5 ML injection Inject into the muscle. 01/26/22     valsartan-hydrochlorothiazide (DIOVAN-HCT) 80-12.5 MG tablet Take 1 tablet by mouth daily. 01/11/20   [provider]      Allergies    Patient has no known allergies.    Review of Systems   Review of Systems  HENT:  Positive for congestion.   All other systems reviewed and are negative.   Physical Exam Updated Vital Signs BP (!) 146/87 (BP Location: Right Arm)   Pulse 82   Temp 99.2 F (37.3 C) (Oral)   Resp 18   SpO2 97%  Physical Exam Vitals and nursing note reviewed.  Constitutional:      General: He is not in acute distress.    Appearance: Normal appearance. He is normal weight. He is not ill-appearing, toxic-appearing or diaphoretic.  HENT:     Head: Normocephalic and atraumatic.     Mouth/Throat:     Mouth: Mucous membranes are moist.  Eyes:     Extraocular Movements:  Extraocular movements intact.     Pupils: Pupils are equal, round, and reactive to light.  Neck:     Comments: No meningismus Cardiovascular:     Rate and Rhythm: Normal rate and regular rhythm.     Heart sounds: Normal heart sounds.  Pulmonary:     Effort: Pulmonary effort is normal. No respiratory distress.     Breath sounds: Normal breath sounds.  Abdominal:     General: Abdomen is flat.     Palpations: Abdomen is soft.  Musculoskeletal:        General: Normal range of motion.     Cervical back: Normal range of motion and neck supple.     Right lower leg: No edema.     Left lower leg: No edema.  Skin:    General: Skin is warm and dry.  Neurological:     General: No focal deficit present.     Mental Status: He is alert.  Psychiatric:        Mood and Affect: Mood normal.        Behavior: Behavior normal.     ED Results / Procedures / Treatments   Labs (all labs ordered are listed, but only abnormal results are displayed) Labs Reviewed  RESP PANEL BY RT-PCR (FLU A&B, COVID) ARPGX2 - Abnormal; Notable for the following components:      Result Value  SARS Coronavirus 2 by RT PCR POSITIVE (*)    All other components within normal limits    EKG None  Radiology No results found.  Procedures Procedures    Medications Ordered in ED Medications - No data to display  ED Course/ Medical Decision Making/ A&P                           Medical Decision Making  Patient presents today with complaints of headache, body aches, and congestion since yesterday.  He is afebrile, nontoxic-appearing, and in no acute distress with reassuring vital signs.  COVID and flu swab obtained, patient is positive for COVID today.  His lungs are clear to auscultation in all fields and he denies any shortness of breath.  Given short duration of symptoms, no concern for associated pneumonia and therefore will defer chest x-ray imaging at this time.  Given short duration of symptoms, and  patient's age and history of obesity, discussed Paxlovid with the patient.  However, upon chart review both to find a recent creatinine and therefore would require this test to prescribe this medication.  Discussed with the patient who does not want to stay for additional testing.  Will instead give Tessalon and Flonase for symptomatic relief as well as additional over-the-counter recommendations.  No further emergent concerns, patient is stable for discharge.  Patient is understanding and amenable with plan, educated on red flag symptoms that would prompt immediate return.  Patient discharged in stable condition.   Final Clinical Impression(s) / ED Diagnoses Final diagnoses:  COVID    Rx / DC Orders ED Discharge Orders          Ordered    benzonatate (TESSALON) 100 MG capsule  Every 8 hours        03/23/22 2222    fluticasone (FLONASE) 50 MCG/ACT nasal spray  Daily        03/23/22 2222          An After Visit Summary was printed and given to the patient.     Nestor Lewandowsky 03/23/22 2223    Sherwood Gambler, MD 03/28/22 [phone]

## 2022-03-23 NOTE — Discharge Instructions (Signed)
As we discussed, your COVID swab was positive today.  Recent CDC guidelines recommend that you quarantine for 5 days following a positive test and then wear a mask for the subsequent 5 days.  For management of your symptoms, I recommend that you get plenty of rest and focus on symptomatic relief which includes Cepacol throat lozenges for sore throat, Mucinex D (orange box) which you can get from behind the counter at your local pharmacy for congestion, and tylenol/ibuprofen as needed for fevers and bodyaches. I have also given you a prescription for Flonase and Tessalon which is a cough suppressant and congestion medication for you to take as prescribed as needed for management of your symptoms. I also recommend:  Increased fluid intake. Sports drinks offer valuable electrolytes, sugars, and fluids.  Breathing heated mist or steam (vaporizer or shower).  Eating chicken soup or other clear broths, and maintaining good nutrition.   Increasing usage of your inhaler if you have asthma.  Return to work when your temperature has returned to normal.  Gargle warm salt water and spit it out for sore throat. Take benadryl or Zyrtec to decrease sinus secretions.  Follow Up: Follow up with your primary care doctor in 5-7 days for recheck of ongoing symptoms.  Return to emergency department for emergent changing or worsening of symptoms.

## 2022-03-23 NOTE — ED Triage Notes (Signed)
Body aches, head ache, cough, sore throat, congestion Recently at pro football game thinks he picked something up

## 2022-07-11 ENCOUNTER — Ambulatory Visit
Admission: RE | Admit: 2022-07-11 | Discharge: 2022-07-11 | Disposition: A | Discharge status: Home / Self Care | Payer: BC Managed Care – PPO | Source: Ambulatory Visit | Attending: Internal Medicine | Admitting: Internal Medicine

## 2022-07-11 ENCOUNTER — Other Ambulatory Visit: Payer: Self-pay | Admitting: Internal Medicine

## 2022-07-11 ENCOUNTER — Ambulatory Visit: Payer: BC Managed Care – PPO

## 2022-07-11 ENCOUNTER — Other Ambulatory Visit (HOSPITAL_COMMUNITY): Payer: Self-pay | Admitting: Internal Medicine

## 2022-07-11 DIAGNOSIS — M549 Dorsalgia, unspecified: Secondary | ICD-10-CM

## 2022-07-12 ENCOUNTER — Ambulatory Visit
Admission: RE | Admit: 2022-07-12 | Discharge: 2022-07-12 | Disposition: A | Discharge status: Home / Self Care | Payer: BC Managed Care – PPO | Source: Ambulatory Visit | Attending: Internal Medicine | Admitting: Internal Medicine

## 2022-07-12 DIAGNOSIS — M549 Dorsalgia, unspecified: Secondary | ICD-10-CM

## 2022-10-31 ENCOUNTER — Other Ambulatory Visit: Payer: Self-pay | Admitting: Internal Medicine

## 2022-10-31 DIAGNOSIS — M5416 Radiculopathy, lumbar region: Secondary | ICD-10-CM

## 2022-11-14 ENCOUNTER — Ambulatory Visit: Admission: RE | Admit: 2022-11-14 | Payer: BC Managed Care – PPO | Source: Ambulatory Visit

## 2022-11-14 DIAGNOSIS — M5416 Radiculopathy, lumbar region: Secondary | ICD-10-CM

## 2023-01-27 ENCOUNTER — Inpatient Hospital Stay: Payer: BC Managed Care – PPO | Attending: Oncology

## 2023-01-27 ENCOUNTER — Inpatient Hospital Stay (HOSPITAL_BASED_OUTPATIENT_CLINIC_OR_DEPARTMENT_OTHER): Payer: BC Managed Care – PPO | Admitting: Oncology

## 2023-01-27 ENCOUNTER — Inpatient Hospital Stay: Payer: BC Managed Care – PPO

## 2023-01-27 VITALS — BP 141/90 | HR 60 | Temp 97.7°F | Resp 18 | Ht 73.0 in | Wt 254.0 lb

## 2023-01-27 DIAGNOSIS — D582 Other hemoglobinopathies: Secondary | ICD-10-CM

## 2023-01-27 DIAGNOSIS — D696 Thrombocytopenia, unspecified: Secondary | ICD-10-CM | POA: Diagnosis not present

## 2023-01-27 DIAGNOSIS — D72829 Elevated white blood cell count, unspecified: Secondary | ICD-10-CM | POA: Diagnosis not present

## 2023-01-27 DIAGNOSIS — Z23 Encounter for immunization: Secondary | ICD-10-CM

## 2023-01-27 LAB — CBC WITH DIFFERENTIAL (CANCER CENTER ONLY)
Abs Immature Granulocytes: 0.04 10*3/uL (ref 0.00–0.07)
Basophils Absolute: 0.1 10*3/uL (ref 0.0–0.1)
Basophils Relative: 1 %
Eosinophils Absolute: 0.5 10*3/uL (ref 0.0–0.5)
Eosinophils Relative: 5 %
HCT: 35.3 % — ABNORMAL LOW (ref 39.0–52.0)
Hemoglobin: 12.5 g/dL — ABNORMAL LOW (ref 13.0–17.0)
Immature Granulocytes: 0 %
Lymphocytes Relative: 32 %
Lymphs Abs: 2.9 10*3/uL (ref 0.7–4.0)
MCH: 27.3 pg (ref 26.0–34.0)
MCHC: 35.4 g/dL (ref 30.0–36.0)
MCV: 77.1 fL — ABNORMAL LOW (ref 80.0–100.0)
Monocytes Absolute: 0.4 10*3/uL (ref 0.1–1.0)
Monocytes Relative: 5 %
Neutro Abs: 5.2 10*3/uL (ref 1.7–7.7)
Neutrophils Relative %: 57 %
Platelet Count: 94 10*3/uL — ABNORMAL LOW (ref 150–400)
RBC: 4.58 MIL/uL (ref 4.22–5.81)
RDW: 17.6 % — ABNORMAL HIGH (ref 11.5–15.5)
WBC Count: 9.1 10*3/uL (ref 4.0–10.5)
nRBC: 0.4 % — ABNORMAL HIGH (ref 0.0–0.2)

## 2023-01-27 MED ORDER — INFLUENZA VIRUS VACC SPLIT PF (FLUZONE) 0.5 ML IM SUSY
0.5000 mL | PREFILLED_SYRINGE | INTRAMUSCULAR | Status: AC
  Administered 2023-01-27: 0.5 mL via INTRAMUSCULAR
  Filled 2023-01-27: qty 0.5

## 2023-01-27 NOTE — Progress Notes (Signed)
  [facility] OFFICE PROGRESS NOTE   Diagnosis: Hemoglobin C  INTERVAL HISTORY:   Mr. Turay returns as scheduled.  He feels well.  He reports developing "sciatica "in August.  He has undergone an MRI and is followed by neurosurgery.  He reports prednisone helped, but the pain has returned.  He is scheduled for a follow-up appointment with Dr. Franky Macho.  Objective:  Vital signs in last 24 hours:  Blood pressure (!) 141/90, pulse 60, temperature 97.7 F (36.5 C), temperature source Tympanic, resp. rate 18, height 6\' 1"  (1.854 m), weight 254 lb (115.2 kg), SpO2 97%.    Resp: Lungs clear bilaterally Cardio: Regular rate and rhythm GI: No hepatomegaly, the spleen tip is palpable in the left upper abdomen Vascular: No leg edema  Lab Results:  Lab Results  Component Value Date   WBC 9.1 01/27/2023   HGB 12.5 (L) 01/27/2023   HCT 35.3 (L) 01/27/2023   MCV 77.1 (L) 01/27/2023   PLT 94 (L) 01/27/2023   NEUTROABS 5.2 01/27/2023    Medications: I have reviewed the patient's current medications.   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 Pneumococcal vaccine - received a 23 valent pneumococcal vaccine 01/12/2021.  He received the 13 valent pneumococcal vaccine 06/30/2014 Chronic mild leukocytosis      Disposition: Larry Berry appears stable.  He has chronic mild thrombocytopenia.  He Sivan influenza vaccine today.  He will obtain a COVID-19 vaccine.  He will be scheduled for a pneumococcal 20 vaccine in a few years.  He will follow-up with neurosurgery for evaluation and management of the lumbar disc disease.  Thornton Papas, MD  01/27/2023  10:47 AM

## 2023-04-11 ENCOUNTER — Encounter (HOSPITAL_COMMUNITY): Payer: Self-pay

## 2023-04-11 ENCOUNTER — Ambulatory Visit (HOSPITAL_COMMUNITY)
Admission: RE | Admit: 2023-04-11 | Discharge: 2023-04-11 | Disposition: A | Discharge status: Home / Self Care | Payer: BC Managed Care – PPO | Source: Ambulatory Visit | Attending: Family | Admitting: Family

## 2023-04-11 VITALS — BP 154/90 | HR 64 | Temp 97.9°F | Resp 18 | Ht 73.0 in | Wt 255.0 lb

## 2023-04-11 DIAGNOSIS — R051 Acute cough: Secondary | ICD-10-CM

## 2023-04-11 DIAGNOSIS — U071 COVID-19: Secondary | ICD-10-CM

## 2023-04-11 HISTORY — DX: Essential (primary) hypertension: I10

## 2023-04-11 MED ORDER — PROMETHAZINE-DM 6.25-15 MG/5ML PO SYRP: 5.0000 mL | ORAL_SOLUTION | Freq: Three times a day (TID) | ORAL | 0 refills | Status: DC | PRN

## 2023-04-11 NOTE — ED Provider Notes (Signed)
[facility]    CSN: [id number] Arrival date & time: 04/11/23  1047      History   Chief Complaint Chief Complaint  Patient presents with   Cough    Entered by patient   Sore Throat   Nasal Congestion    HPI Larry Berry is a 62 y.o. male.   62 year old male presents with cough, nasal congestion for the past 3 days.   The history is provided by the patient.  Cough Sore Throat    Past Medical History:  Diagnosis Date   Anemia    hx homozygous hemoglobin C disease- follwed by Dr. Truett Perna   Arthritis    right big toe arthritis   GERD (gastroesophageal reflux disease)    mild, occasinally OTC med used   Hepatitis    Hepatitis C-dx. '83, no problems ever   Sickle cell trait (HCC)    Sleep apnea    cpap, doesn't use    Patient Active Problem List   Diagnosis Date Noted   Snoring 11/21/2018    Past Surgical History:  Procedure Laterality Date   COLONOSCOPY WITH PROPOFOL N/A 05/06/2014   Procedure: COLONOSCOPY WITH PROPOFOL;  Surgeon: Charolett Bumpers, MD;  Location: [facility];  Service: Endoscopy;  Laterality: N/A;   KNEE SURGERY Right    open '93 ACL repair       Home Medications    Prior to Admission medications   Medication Sig Start Date End Date Taking? Authorizing Provider  promethazine-dextromethorphan (PROMETHAZINE-DM) 6.25-15 MG/5ML syrup Take 5 mLs by mouth every 8 (eight) hours as needed for cough ((use mainly at night)). 04/11/23  Yes Kain Milosevic, Ali Lowe, NP  valsartan-hydrochlorothiazide (DIOVAN-HCT) 80-12.5 MG tablet Take 1 tablet by mouth daily. 01/11/20   [provider]    Family History History reviewed. No pertinent family history.  Social History Social History   Tobacco Use   Smoking status: Former    Current packs/day: 0.00    Average packs/day: 0.5 packs/day for 20.0 years (10.0 ttl pk-yrs)    Types: Cigarettes    Start date: 04/23/1983    Quit date: 04/23/2003    Years since quitting: 19.9    Smokeless tobacco: Never  Vaping Use   Vaping status: Never Used  Substance Use Topics   Alcohol use: Yes    Comment: occ.- -rare social   Drug use: No     Allergies   Lisinopril   Review of Systems Review of Systems  Respiratory:  Positive for cough.      Physical Exam Triage Vital Signs ED Triage Vitals  Encounter Vitals Group     BP 04/11/23 1117 (!) 154/90     Systolic BP Percentile --      Diastolic BP Percentile --      Pulse Rate 04/11/23 1117 64     Resp 04/11/23 1117 18     Temp 04/11/23 1117 97.9 F (36.6 C)     Temp Source 04/11/23 1117 Oral     SpO2 04/11/23 1117 97 %     Weight 04/11/23 1115 255 lb (115.7 kg)     Height 04/11/23 1115 6\' 1"  (1.854 m)     Head Circumference --      Peak Flow --      Pain Score 04/11/23 1115 0     Pain Loc --      Pain Education --      Exclude from Growth Chart --    No data  found.  Updated Vital Signs BP (!) 154/90 (BP Location: Right Arm)   Pulse 64   Temp 97.9 F (36.6 C) (Oral)   Resp 18   Ht 6\' 1"  (1.854 m)   Wt 255 lb (115.7 kg)   SpO2 97%   BMI 33.64 kg/m   Visual Acuity Right Eye Distance:   Left Eye Distance:   Bilateral Distance:    Right Eye Near:   Left Eye Near:    Bilateral Near:     Physical Exam Vitals and nursing note reviewed.  HENT:     Head: Normocephalic and atraumatic.     Right Ear: Hearing, tympanic membrane, ear canal and external ear normal.     Left Ear: Hearing, tympanic membrane and external ear normal.     Ears:     Comments: Some soft cerumen in left ear canal- not completely blocking canal.  Pulmonary:     Effort: Pulmonary effort is normal.     Breath sounds: Normal breath sounds and air entry. No decreased air movement. No decreased breath sounds, wheezing, rhonchi or rales.  Neurological:     Mental Status: He is alert.  Psychiatric:        Behavior: Behavior is cooperative.      UC Treatments / Results  Labs (all labs ordered are listed, but only  abnormal results are displayed) Labs Reviewed - No data to display  EKG   Radiology No results found.  Procedures Procedures (including critical care time)  Medications Ordered in UC Medications - No data to display  Initial Impression / Assessment and Plan / UC Course  I have reviewed the triage vital signs and the nursing notes.  Pertinent labs & imaging results that were available during my care of the patient were reviewed by me and considered in my medical decision making (see chart for details).     *** Final Clinical Impressions(s) / UC Diagnoses   Final diagnoses:  COVID-19 virus infection  Acute cough     Discharge Instructions      Recommend start Promethazine DM cough syrup- may take 5ml every 8 hours as needed for cough- use mainly at night. Continue to push fluids/tea to help loosen up mucus in sinuses and chest. May continue Ibuprofen 600mg  every 8 hours as needed for pain. Rest. Follow-up in 3 to 4 days if not improving or sooner if worsening.     ED Prescriptions     Medication Sig Dispense Auth. Provider   promethazine-dextromethorphan (PROMETHAZINE-DM) 6.25-15 MG/5ML syrup Take 5 mLs by mouth every 8 (eight) hours as needed for cough ((use mainly at night)). 118 mL Sudie Grumbling, NP      PDMP not reviewed this encounter.

## 2023-04-11 NOTE — ED Triage Notes (Signed)
Pt presents to urgent care with complaints of nasal congestion, cough, and sore throat x 3 days. Pt states he took an at home COVID test on Saturday 12/7 and it was positive. Pt currently denies pain at this time. Pt states he has been taking Ibuprofen and drinking tea with relief.

## 2023-04-11 NOTE — Discharge Instructions (Addendum)
Recommend start Promethazine DM cough syrup- may take 5ml every 8 hours as needed for cough- use mainly at night. Continue to push fluids/tea to help loosen up mucus in sinuses and chest. May continue Ibuprofen 600mg  every 8 hours as needed for pain. Rest. Follow-up in 3 to 4 days if not improving or sooner if worsening.

## 2023-04-12 ENCOUNTER — Encounter (HOSPITAL_COMMUNITY): Payer: Self-pay

## 2024-01-26 ENCOUNTER — Inpatient Hospital Stay: Payer: Self-pay

## 2024-01-26 ENCOUNTER — Other Ambulatory Visit: Payer: BC Managed Care – PPO

## 2024-01-26 ENCOUNTER — Inpatient Hospital Stay: Payer: BC Managed Care – PPO | Attending: Oncology | Admitting: Oncology

## 2024-01-26 VITALS — BP 127/73 | HR 79 | Temp 98.0°F | Resp 18 | Ht 73.0 in | Wt 249.9 lb

## 2024-01-26 DIAGNOSIS — D582 Other hemoglobinopathies: Secondary | ICD-10-CM

## 2024-01-26 DIAGNOSIS — D72829 Elevated white blood cell count, unspecified: Secondary | ICD-10-CM | POA: Diagnosis not present

## 2024-01-26 DIAGNOSIS — D696 Thrombocytopenia, unspecified: Secondary | ICD-10-CM | POA: Diagnosis not present

## 2024-01-26 LAB — CBC WITH DIFFERENTIAL (CANCER CENTER ONLY)
Abs Immature Granulocytes: 0.04 K/uL (ref 0.00–0.07)
Basophils Absolute: 0.1 K/uL (ref 0.0–0.1)
Basophils Relative: 1 %
Eosinophils Absolute: 0.5 K/uL (ref 0.0–0.5)
Eosinophils Relative: 5 %
HCT: 36.8 % — ABNORMAL LOW (ref 39.0–52.0)
Hemoglobin: 13.1 g/dL (ref 13.0–17.0)
Immature Granulocytes: 0 %
Lymphocytes Relative: 32 %
Lymphs Abs: 3.1 K/uL (ref 0.7–4.0)
MCH: 27.3 pg (ref 26.0–34.0)
MCHC: 35.6 g/dL (ref 30.0–36.0)
MCV: 76.8 fL — ABNORMAL LOW (ref 80.0–100.0)
Monocytes Absolute: 0.5 K/uL (ref 0.1–1.0)
Monocytes Relative: 5 %
Neutro Abs: 5.7 K/uL (ref 1.7–7.7)
Neutrophils Relative %: 57 %
Platelet Count: 101 K/uL — ABNORMAL LOW (ref 150–400)
RBC: 4.79 MIL/uL (ref 4.22–5.81)
RDW: 17.2 % — ABNORMAL HIGH (ref 11.5–15.5)
WBC Count: 9.9 K/uL (ref 4.0–10.5)
nRBC: 0.5 % — ABNORMAL HIGH (ref 0.0–0.2)

## 2024-01-26 NOTE — Progress Notes (Signed)
  [facility] OFFICE PROGRESS NOTE   Diagnosis: Hemoglobin C  INTERVAL HISTORY:   Larry Berry returns as scheduled.  He feels well.  He reports intentional weight loss.  No recent infection.  He has intermittent flares of gout at the great toe.  This is relieved with colchicine.  Objective:  Vital signs in last 24 hours:  Blood pressure 127/73, pulse 79, temperature 98 F (36.7 C), temperature source Temporal, resp. rate 18, height 6' 1 (1.854 m), weight 249 lb 14.4 oz (113.4 kg), SpO2 98%.    Resp: Lungs clear bilaterally Cardio: Regular rate and rhythm GI: The spleen tip is palpable in the left upper abdomen, no hepatomegaly Vascular: No leg edema  Lab Results:  Lab Results  Component Value Date   WBC 9.9 01/26/2024   HGB 13.1 01/26/2024   HCT 36.8 (L) 01/26/2024   MCV 76.8 (L) 01/26/2024   PLT 101 (L) 01/26/2024   NEUTROABS 5.7 01/26/2024     Medications: I have reviewed the patient's current medications.   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 Pneumococcal vaccine - received a 23 valent pneumococcal vaccine 01/12/2021.  He received the 13 valent pneumococcal vaccine 06/30/2014 Chronic mild leukocytosis Gout      Disposition: Mr. Larry Berry appears unchanged.  He is stable from a hematologic standpoint.  He will return for an office visit and CBC in 1 year.  He will be due for a pneumococcal 21 vaccine in 2027.  He plans to obtain an influenza vaccine in a few weeks.   Arley Hof, MD  01/26/2024  11:41 AM

## 2024-04-18 ENCOUNTER — Other Ambulatory Visit: Payer: Self-pay

## 2024-04-18 ENCOUNTER — Encounter (HOSPITAL_COMMUNITY): Payer: Self-pay

## 2024-04-18 ENCOUNTER — Emergency Department (HOSPITAL_COMMUNITY)

## 2024-04-18 ENCOUNTER — Emergency Department (HOSPITAL_COMMUNITY)
Admission: EM | Admit: 2024-04-18 | Discharge: 2024-04-18 | Disposition: A | Discharge status: Home / Self Care | Attending: Emergency Medicine | Admitting: Emergency Medicine

## 2024-04-18 DIAGNOSIS — W3400XA Accidental discharge from unspecified firearms or gun, initial encounter: Secondary | ICD-10-CM | POA: Insufficient documentation

## 2024-04-18 DIAGNOSIS — Z23 Encounter for immunization: Secondary | ICD-10-CM | POA: Insufficient documentation

## 2024-04-18 DIAGNOSIS — S6992XA Unspecified injury of left wrist, hand and finger(s), initial encounter: Secondary | ICD-10-CM | POA: Diagnosis present

## 2024-04-18 DIAGNOSIS — Y9389 Activity, other specified: Secondary | ICD-10-CM | POA: Diagnosis not present

## 2024-04-18 DIAGNOSIS — I10 Essential (primary) hypertension: Secondary | ICD-10-CM | POA: Diagnosis not present

## 2024-04-18 DIAGNOSIS — S61201A Unspecified open wound of left index finger without damage to nail, initial encounter: Secondary | ICD-10-CM | POA: Diagnosis not present

## 2024-04-18 DIAGNOSIS — Y249XXA Unspecified firearm discharge, undetermined intent, initial encounter: Secondary | ICD-10-CM

## 2024-04-18 DIAGNOSIS — Z79899 Other long term (current) drug therapy: Secondary | ICD-10-CM | POA: Diagnosis not present

## 2024-04-18 DIAGNOSIS — S61221A Laceration with foreign body of left index finger without damage to nail, initial encounter: Secondary | ICD-10-CM

## 2024-04-18 DIAGNOSIS — S61002A Unspecified open wound of left thumb without damage to nail, initial encounter: Secondary | ICD-10-CM | POA: Insufficient documentation

## 2024-04-18 DIAGNOSIS — T148XXA Other injury of unspecified body region, initial encounter: Secondary | ICD-10-CM

## 2024-04-18 MED ORDER — TETANUS-DIPHTH-ACELL PERTUSSIS 5-2-15.5 LF-MCG/0.5 IM SUSP
0.5000 mL | Freq: Once | INTRAMUSCULAR | Status: AC
  Administered 2024-04-18: 12:00:00 0.5 mL via INTRAMUSCULAR
  Filled 2024-04-18: qty 0.5

## 2024-04-18 MED ORDER — LIDOCAINE-EPINEPHRINE-TETRACAINE (LET) TOPICAL GEL
3.0000 mL | Freq: Once | TOPICAL | Status: AC
  Administered 2024-04-18: 10:00:00 3 mL via TOPICAL
  Filled 2024-04-18: qty 3

## 2024-04-18 MED ORDER — CEFAZOLIN SODIUM-DEXTROSE 2-4 GM/100ML-% IV SOLN
2.0000 g | Freq: Once | INTRAVENOUS | Status: AC
  Administered 2024-04-18: 10:00:00 2 g via INTRAVENOUS
  Filled 2024-04-18: qty 100

## 2024-04-18 MED ORDER — LIDOCAINE HCL (PF) 1 % IJ SOLN
5.0000 mL | Freq: Once | INTRAMUSCULAR | Status: AC
  Administered 2024-04-18: 10:00:00 5 mL via INTRADERMAL
  Filled 2024-04-18: qty 5

## 2024-04-18 MED ORDER — CEPHALEXIN 500 MG PO CAPS: 500.0000 mg | ORAL_CAPSULE | Freq: Four times a day (QID) | ORAL | 0 refills | Status: AC
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
## 2025-01-23 ENCOUNTER — Ambulatory Visit: Admitting: Oncology

## 2025-01-23 ENCOUNTER — Other Ambulatory Visit
